# Patient Record
Sex: Male | Born: 1945
Health system: Southern US, Community
[De-identification: ages and names within clinical notes are randomized; demographics above are authoritative.]

## PROBLEM LIST (undated history)

## (undated) DIAGNOSIS — N289 Disorder of kidney and ureter, unspecified: Secondary | ICD-10-CM

## (undated) DIAGNOSIS — I2510 Atherosclerotic heart disease of native coronary artery without angina pectoris: Secondary | ICD-10-CM

## (undated) DIAGNOSIS — I10 Essential (primary) hypertension: Secondary | ICD-10-CM

## (undated) DIAGNOSIS — E079 Disorder of thyroid, unspecified: Secondary | ICD-10-CM

---

## 2008-10-28 ENCOUNTER — Inpatient Hospital Stay: Payer: Self-pay | Admitting: Internal Medicine

## 2011-04-09 ENCOUNTER — Ambulatory Visit: Payer: Self-pay | Admitting: Unknown Physician Specialty

## 2011-04-10 LAB — PATHOLOGY REPORT

## 2011-08-16 ENCOUNTER — Ambulatory Visit: Payer: Self-pay | Admitting: Cardiovascular Disease

## 2011-11-14 DIAGNOSIS — E782 Mixed hyperlipidemia: Secondary | ICD-10-CM | POA: Diagnosis not present

## 2011-11-14 DIAGNOSIS — I10 Essential (primary) hypertension: Secondary | ICD-10-CM | POA: Diagnosis not present

## 2011-11-14 DIAGNOSIS — M25529 Pain in unspecified elbow: Secondary | ICD-10-CM | POA: Diagnosis not present

## 2011-11-14 DIAGNOSIS — M7020 Olecranon bursitis, unspecified elbow: Secondary | ICD-10-CM | POA: Diagnosis not present

## 2011-11-14 DIAGNOSIS — R229 Localized swelling, mass and lump, unspecified: Secondary | ICD-10-CM | POA: Diagnosis not present

## 2011-11-20 DIAGNOSIS — M7020 Olecranon bursitis, unspecified elbow: Secondary | ICD-10-CM | POA: Diagnosis not present

## 2011-11-20 DIAGNOSIS — N189 Chronic kidney disease, unspecified: Secondary | ICD-10-CM | POA: Diagnosis not present

## 2011-11-20 DIAGNOSIS — I4891 Unspecified atrial fibrillation: Secondary | ICD-10-CM | POA: Diagnosis not present

## 2011-11-20 DIAGNOSIS — Z79899 Other long term (current) drug therapy: Secondary | ICD-10-CM | POA: Diagnosis not present

## 2011-11-20 DIAGNOSIS — R0602 Shortness of breath: Secondary | ICD-10-CM | POA: Diagnosis not present

## 2011-11-20 DIAGNOSIS — I10 Essential (primary) hypertension: Secondary | ICD-10-CM | POA: Diagnosis not present

## 2011-11-20 DIAGNOSIS — E782 Mixed hyperlipidemia: Secondary | ICD-10-CM | POA: Diagnosis not present

## 2011-11-21 DIAGNOSIS — M7020 Olecranon bursitis, unspecified elbow: Secondary | ICD-10-CM | POA: Diagnosis not present

## 2011-12-03 DIAGNOSIS — I6529 Occlusion and stenosis of unspecified carotid artery: Secondary | ICD-10-CM | POA: Diagnosis not present

## 2011-12-03 DIAGNOSIS — E785 Hyperlipidemia, unspecified: Secondary | ICD-10-CM | POA: Diagnosis not present

## 2011-12-03 DIAGNOSIS — R0989 Other specified symptoms and signs involving the circulatory and respiratory systems: Secondary | ICD-10-CM | POA: Diagnosis not present

## 2011-12-03 DIAGNOSIS — I10 Essential (primary) hypertension: Secondary | ICD-10-CM | POA: Diagnosis not present

## 2011-12-10 DIAGNOSIS — E876 Hypokalemia: Secondary | ICD-10-CM | POA: Diagnosis not present

## 2011-12-10 DIAGNOSIS — I509 Heart failure, unspecified: Secondary | ICD-10-CM | POA: Diagnosis not present

## 2011-12-10 DIAGNOSIS — I10 Essential (primary) hypertension: Secondary | ICD-10-CM | POA: Diagnosis not present

## 2012-01-17 DIAGNOSIS — I428 Other cardiomyopathies: Secondary | ICD-10-CM | POA: Diagnosis not present

## 2012-01-17 DIAGNOSIS — I509 Heart failure, unspecified: Secondary | ICD-10-CM | POA: Diagnosis not present

## 2012-01-17 DIAGNOSIS — I4891 Unspecified atrial fibrillation: Secondary | ICD-10-CM | POA: Diagnosis not present

## 2012-01-17 DIAGNOSIS — I10 Essential (primary) hypertension: Secondary | ICD-10-CM | POA: Diagnosis not present

## 2012-01-22 DIAGNOSIS — Z79899 Other long term (current) drug therapy: Secondary | ICD-10-CM | POA: Diagnosis not present

## 2012-01-22 DIAGNOSIS — I501 Left ventricular failure: Secondary | ICD-10-CM | POA: Diagnosis not present

## 2012-01-22 DIAGNOSIS — E785 Hyperlipidemia, unspecified: Secondary | ICD-10-CM | POA: Diagnosis not present

## 2012-01-22 DIAGNOSIS — I2510 Atherosclerotic heart disease of native coronary artery without angina pectoris: Secondary | ICD-10-CM | POA: Diagnosis not present

## 2012-05-20 DIAGNOSIS — I428 Other cardiomyopathies: Secondary | ICD-10-CM | POA: Diagnosis not present

## 2012-05-20 DIAGNOSIS — Z5181 Encounter for therapeutic drug level monitoring: Secondary | ICD-10-CM | POA: Diagnosis not present

## 2012-05-20 DIAGNOSIS — I10 Essential (primary) hypertension: Secondary | ICD-10-CM | POA: Diagnosis not present

## 2012-05-20 DIAGNOSIS — I4891 Unspecified atrial fibrillation: Secondary | ICD-10-CM | POA: Diagnosis not present

## 2012-05-20 DIAGNOSIS — I509 Heart failure, unspecified: Secondary | ICD-10-CM | POA: Diagnosis not present

## 2012-05-22 DIAGNOSIS — N189 Chronic kidney disease, unspecified: Secondary | ICD-10-CM | POA: Diagnosis not present

## 2012-05-22 DIAGNOSIS — Z125 Encounter for screening for malignant neoplasm of prostate: Secondary | ICD-10-CM | POA: Diagnosis not present

## 2012-05-22 DIAGNOSIS — E782 Mixed hyperlipidemia: Secondary | ICD-10-CM | POA: Diagnosis not present

## 2012-05-22 DIAGNOSIS — I498 Other specified cardiac arrhythmias: Secondary | ICD-10-CM | POA: Diagnosis not present

## 2012-05-22 DIAGNOSIS — I4891 Unspecified atrial fibrillation: Secondary | ICD-10-CM | POA: Diagnosis not present

## 2012-05-22 DIAGNOSIS — Z0000 Encounter for general adult medical examination without abnormal findings: Secondary | ICD-10-CM | POA: Diagnosis not present

## 2012-05-22 DIAGNOSIS — I10 Essential (primary) hypertension: Secondary | ICD-10-CM | POA: Diagnosis not present

## 2012-06-02 DIAGNOSIS — E785 Hyperlipidemia, unspecified: Secondary | ICD-10-CM | POA: Diagnosis not present

## 2012-06-02 DIAGNOSIS — R0989 Other specified symptoms and signs involving the circulatory and respiratory systems: Secondary | ICD-10-CM | POA: Diagnosis not present

## 2012-06-02 DIAGNOSIS — I6529 Occlusion and stenosis of unspecified carotid artery: Secondary | ICD-10-CM | POA: Diagnosis not present

## 2012-06-02 DIAGNOSIS — I10 Essential (primary) hypertension: Secondary | ICD-10-CM | POA: Diagnosis not present

## 2012-06-27 DIAGNOSIS — I10 Essential (primary) hypertension: Secondary | ICD-10-CM | POA: Diagnosis not present

## 2012-06-27 DIAGNOSIS — I4891 Unspecified atrial fibrillation: Secondary | ICD-10-CM | POA: Diagnosis not present

## 2012-06-27 DIAGNOSIS — R7989 Other specified abnormal findings of blood chemistry: Secondary | ICD-10-CM | POA: Diagnosis not present

## 2012-06-27 DIAGNOSIS — N269 Renal sclerosis, unspecified: Secondary | ICD-10-CM | POA: Diagnosis not present

## 2012-07-12 DIAGNOSIS — Z23 Encounter for immunization: Secondary | ICD-10-CM | POA: Diagnosis not present

## 2012-08-20 DIAGNOSIS — L0231 Cutaneous abscess of buttock: Secondary | ICD-10-CM | POA: Diagnosis not present

## 2012-08-26 DIAGNOSIS — L03317 Cellulitis of buttock: Secondary | ICD-10-CM | POA: Diagnosis not present

## 2012-08-26 DIAGNOSIS — L02818 Cutaneous abscess of other sites: Secondary | ICD-10-CM | POA: Diagnosis not present

## 2012-09-03 DIAGNOSIS — I4891 Unspecified atrial fibrillation: Secondary | ICD-10-CM | POA: Diagnosis not present

## 2012-09-03 DIAGNOSIS — I10 Essential (primary) hypertension: Secondary | ICD-10-CM | POA: Diagnosis not present

## 2012-09-03 DIAGNOSIS — I43 Cardiomyopathy in diseases classified elsewhere: Secondary | ICD-10-CM | POA: Diagnosis not present

## 2012-09-03 DIAGNOSIS — L0231 Cutaneous abscess of buttock: Secondary | ICD-10-CM | POA: Diagnosis not present

## 2012-09-03 DIAGNOSIS — A4902 Methicillin resistant Staphylococcus aureus infection, unspecified site: Secondary | ICD-10-CM | POA: Diagnosis not present

## 2012-09-03 DIAGNOSIS — N189 Chronic kidney disease, unspecified: Secondary | ICD-10-CM | POA: Diagnosis not present

## 2012-09-11 ENCOUNTER — Encounter: Payer: Self-pay | Admitting: Nurse Practitioner

## 2012-09-11 ENCOUNTER — Encounter: Payer: Self-pay | Admitting: Cardiothoracic Surgery

## 2012-09-11 DIAGNOSIS — I4891 Unspecified atrial fibrillation: Secondary | ICD-10-CM | POA: Diagnosis not present

## 2012-09-11 DIAGNOSIS — I129 Hypertensive chronic kidney disease with stage 1 through stage 4 chronic kidney disease, or unspecified chronic kidney disease: Secondary | ICD-10-CM | POA: Diagnosis not present

## 2012-09-11 DIAGNOSIS — I43 Cardiomyopathy in diseases classified elsewhere: Secondary | ICD-10-CM | POA: Diagnosis not present

## 2012-09-11 DIAGNOSIS — N189 Chronic kidney disease, unspecified: Secondary | ICD-10-CM | POA: Diagnosis not present

## 2012-09-11 DIAGNOSIS — L03317 Cellulitis of buttock: Secondary | ICD-10-CM | POA: Diagnosis not present

## 2012-09-11 DIAGNOSIS — I509 Heart failure, unspecified: Secondary | ICD-10-CM | POA: Diagnosis not present

## 2012-09-25 DIAGNOSIS — I509 Heart failure, unspecified: Secondary | ICD-10-CM | POA: Diagnosis not present

## 2012-09-25 DIAGNOSIS — I129 Hypertensive chronic kidney disease with stage 1 through stage 4 chronic kidney disease, or unspecified chronic kidney disease: Secondary | ICD-10-CM | POA: Diagnosis not present

## 2012-09-25 DIAGNOSIS — I4891 Unspecified atrial fibrillation: Secondary | ICD-10-CM | POA: Diagnosis not present

## 2012-09-25 DIAGNOSIS — L0231 Cutaneous abscess of buttock: Secondary | ICD-10-CM | POA: Diagnosis not present

## 2012-09-25 DIAGNOSIS — N189 Chronic kidney disease, unspecified: Secondary | ICD-10-CM | POA: Diagnosis not present

## 2012-09-25 DIAGNOSIS — I43 Cardiomyopathy in diseases classified elsewhere: Secondary | ICD-10-CM | POA: Diagnosis not present

## 2012-09-25 DIAGNOSIS — L03317 Cellulitis of buttock: Secondary | ICD-10-CM | POA: Diagnosis not present

## 2012-10-20 DIAGNOSIS — R072 Precordial pain: Secondary | ICD-10-CM | POA: Diagnosis not present

## 2012-10-22 DIAGNOSIS — I428 Other cardiomyopathies: Secondary | ICD-10-CM | POA: Diagnosis not present

## 2012-10-22 DIAGNOSIS — I4891 Unspecified atrial fibrillation: Secondary | ICD-10-CM | POA: Diagnosis not present

## 2012-10-22 DIAGNOSIS — I509 Heart failure, unspecified: Secondary | ICD-10-CM | POA: Diagnosis not present

## 2012-10-22 DIAGNOSIS — I10 Essential (primary) hypertension: Secondary | ICD-10-CM | POA: Diagnosis not present

## 2012-10-22 DIAGNOSIS — I2510 Atherosclerotic heart disease of native coronary artery without angina pectoris: Secondary | ICD-10-CM | POA: Diagnosis not present

## 2012-10-24 DIAGNOSIS — I4891 Unspecified atrial fibrillation: Secondary | ICD-10-CM | POA: Diagnosis not present

## 2012-10-24 DIAGNOSIS — Z79899 Other long term (current) drug therapy: Secondary | ICD-10-CM | POA: Diagnosis not present

## 2012-10-24 DIAGNOSIS — N189 Chronic kidney disease, unspecified: Secondary | ICD-10-CM | POA: Diagnosis not present

## 2012-10-24 DIAGNOSIS — I509 Heart failure, unspecified: Secondary | ICD-10-CM | POA: Diagnosis not present

## 2012-10-24 DIAGNOSIS — I428 Other cardiomyopathies: Secondary | ICD-10-CM | POA: Diagnosis not present

## 2012-10-24 DIAGNOSIS — I10 Essential (primary) hypertension: Secondary | ICD-10-CM | POA: Diagnosis not present

## 2012-10-24 DIAGNOSIS — I2510 Atherosclerotic heart disease of native coronary artery without angina pectoris: Secondary | ICD-10-CM | POA: Diagnosis not present

## 2012-10-24 DIAGNOSIS — E785 Hyperlipidemia, unspecified: Secondary | ICD-10-CM | POA: Diagnosis not present

## 2012-11-25 DIAGNOSIS — I4892 Unspecified atrial flutter: Secondary | ICD-10-CM | POA: Diagnosis not present

## 2012-11-25 DIAGNOSIS — I4891 Unspecified atrial fibrillation: Secondary | ICD-10-CM | POA: Diagnosis not present

## 2012-11-25 DIAGNOSIS — I10 Essential (primary) hypertension: Secondary | ICD-10-CM | POA: Diagnosis not present

## 2012-11-25 DIAGNOSIS — I509 Heart failure, unspecified: Secondary | ICD-10-CM | POA: Diagnosis not present

## 2012-12-04 DIAGNOSIS — I2510 Atherosclerotic heart disease of native coronary artery without angina pectoris: Secondary | ICD-10-CM | POA: Diagnosis not present

## 2012-12-04 DIAGNOSIS — R0989 Other specified symptoms and signs involving the circulatory and respiratory systems: Secondary | ICD-10-CM | POA: Diagnosis not present

## 2012-12-04 DIAGNOSIS — I6529 Occlusion and stenosis of unspecified carotid artery: Secondary | ICD-10-CM | POA: Diagnosis not present

## 2012-12-04 DIAGNOSIS — I10 Essential (primary) hypertension: Secondary | ICD-10-CM | POA: Diagnosis not present

## 2013-02-04 DIAGNOSIS — Z7902 Long term (current) use of antithrombotics/antiplatelets: Secondary | ICD-10-CM | POA: Diagnosis not present

## 2013-02-04 DIAGNOSIS — N189 Chronic kidney disease, unspecified: Secondary | ICD-10-CM | POA: Diagnosis not present

## 2013-02-04 DIAGNOSIS — E782 Mixed hyperlipidemia: Secondary | ICD-10-CM | POA: Diagnosis not present

## 2013-02-04 DIAGNOSIS — I10 Essential (primary) hypertension: Secondary | ICD-10-CM | POA: Diagnosis not present

## 2013-02-04 DIAGNOSIS — I4891 Unspecified atrial fibrillation: Secondary | ICD-10-CM | POA: Diagnosis not present

## 2013-02-04 DIAGNOSIS — R7301 Impaired fasting glucose: Secondary | ICD-10-CM | POA: Diagnosis not present

## 2013-03-03 ENCOUNTER — Emergency Department: Payer: Self-pay | Admitting: Emergency Medicine

## 2013-03-03 DIAGNOSIS — R55 Syncope and collapse: Secondary | ICD-10-CM | POA: Diagnosis not present

## 2013-03-03 DIAGNOSIS — J00 Acute nasopharyngitis [common cold]: Secondary | ICD-10-CM | POA: Diagnosis not present

## 2013-03-03 DIAGNOSIS — Z7982 Long term (current) use of aspirin: Secondary | ICD-10-CM | POA: Diagnosis not present

## 2013-03-03 DIAGNOSIS — Z79899 Other long term (current) drug therapy: Secondary | ICD-10-CM | POA: Diagnosis not present

## 2013-03-03 DIAGNOSIS — J029 Acute pharyngitis, unspecified: Secondary | ICD-10-CM | POA: Diagnosis not present

## 2013-03-03 DIAGNOSIS — R42 Dizziness and giddiness: Secondary | ICD-10-CM | POA: Diagnosis not present

## 2013-03-03 DIAGNOSIS — R6889 Other general symptoms and signs: Secondary | ICD-10-CM | POA: Diagnosis not present

## 2013-03-03 DIAGNOSIS — I4891 Unspecified atrial fibrillation: Secondary | ICD-10-CM | POA: Diagnosis not present

## 2013-03-03 DIAGNOSIS — I498 Other specified cardiac arrhythmias: Secondary | ICD-10-CM | POA: Diagnosis not present

## 2013-03-03 DIAGNOSIS — I10 Essential (primary) hypertension: Secondary | ICD-10-CM | POA: Diagnosis not present

## 2013-03-03 LAB — URINALYSIS, COMPLETE
Leukocyte Esterase: NEGATIVE
Nitrite: NEGATIVE
Ph: 5 (ref 4.5–8.0)
Specific Gravity: 1.026 (ref 1.003–1.030)
WBC UR: 1 /HPF (ref 0–5)

## 2013-03-03 LAB — COMPREHENSIVE METABOLIC PANEL
Anion Gap: 9 (ref 7–16)
BUN: 18 mg/dL (ref 7–18)
Chloride: 107 mmol/L (ref 98–107)
Co2: 24 mmol/L (ref 21–32)
Creatinine: 2.14 mg/dL — ABNORMAL HIGH (ref 0.60–1.30)
EGFR (African American): 36 — ABNORMAL LOW
EGFR (Non-African Amer.): 31 — ABNORMAL LOW
Glucose: 134 mg/dL — ABNORMAL HIGH (ref 65–99)
Osmolality: 283 (ref 275–301)
Potassium: 3.6 mmol/L (ref 3.5–5.1)
SGOT(AST): 23 U/L (ref 15–37)
SGPT (ALT): 23 U/L (ref 12–78)
Total Protein: 8 g/dL (ref 6.4–8.2)

## 2013-03-03 LAB — CK TOTAL AND CKMB (NOT AT ARMC): CK, Total: 187 U/L (ref 35–232)

## 2013-03-03 LAB — CBC
HCT: 43.2 % (ref 40.0–52.0)
HGB: 14.7 g/dL (ref 13.0–18.0)
MCH: 30.6 pg (ref 26.0–34.0)
MCHC: 34.1 g/dL (ref 32.0–36.0)
MCV: 90 fL (ref 80–100)
RDW: 14.7 % — ABNORMAL HIGH (ref 11.5–14.5)

## 2013-03-06 DIAGNOSIS — I4892 Unspecified atrial flutter: Secondary | ICD-10-CM | POA: Diagnosis not present

## 2013-03-06 DIAGNOSIS — J3089 Other allergic rhinitis: Secondary | ICD-10-CM | POA: Diagnosis not present

## 2013-03-06 DIAGNOSIS — N189 Chronic kidney disease, unspecified: Secondary | ICD-10-CM | POA: Diagnosis not present

## 2013-03-06 DIAGNOSIS — I428 Other cardiomyopathies: Secondary | ICD-10-CM | POA: Diagnosis not present

## 2013-03-06 DIAGNOSIS — I43 Cardiomyopathy in diseases classified elsewhere: Secondary | ICD-10-CM | POA: Diagnosis not present

## 2013-03-06 DIAGNOSIS — I959 Hypotension, unspecified: Secondary | ICD-10-CM | POA: Diagnosis not present

## 2013-03-06 DIAGNOSIS — I10 Essential (primary) hypertension: Secondary | ICD-10-CM | POA: Diagnosis not present

## 2013-03-06 DIAGNOSIS — I4891 Unspecified atrial fibrillation: Secondary | ICD-10-CM | POA: Diagnosis not present

## 2013-03-06 DIAGNOSIS — R42 Dizziness and giddiness: Secondary | ICD-10-CM | POA: Diagnosis not present

## 2013-03-06 DIAGNOSIS — I498 Other specified cardiac arrhythmias: Secondary | ICD-10-CM | POA: Diagnosis not present

## 2013-03-24 DIAGNOSIS — I509 Heart failure, unspecified: Secondary | ICD-10-CM | POA: Diagnosis not present

## 2013-03-24 DIAGNOSIS — I4891 Unspecified atrial fibrillation: Secondary | ICD-10-CM | POA: Diagnosis not present

## 2013-03-24 DIAGNOSIS — E785 Hyperlipidemia, unspecified: Secondary | ICD-10-CM | POA: Diagnosis not present

## 2013-03-24 DIAGNOSIS — I10 Essential (primary) hypertension: Secondary | ICD-10-CM | POA: Diagnosis not present

## 2013-04-21 DIAGNOSIS — I428 Other cardiomyopathies: Secondary | ICD-10-CM | POA: Diagnosis not present

## 2013-04-21 DIAGNOSIS — I4891 Unspecified atrial fibrillation: Secondary | ICD-10-CM | POA: Diagnosis not present

## 2013-04-21 DIAGNOSIS — I509 Heart failure, unspecified: Secondary | ICD-10-CM | POA: Diagnosis not present

## 2013-05-05 DIAGNOSIS — I10 Essential (primary) hypertension: Secondary | ICD-10-CM | POA: Diagnosis not present

## 2013-05-05 DIAGNOSIS — I509 Heart failure, unspecified: Secondary | ICD-10-CM | POA: Diagnosis not present

## 2013-05-05 DIAGNOSIS — I4891 Unspecified atrial fibrillation: Secondary | ICD-10-CM | POA: Diagnosis not present

## 2013-05-26 DIAGNOSIS — Z0000 Encounter for general adult medical examination without abnormal findings: Secondary | ICD-10-CM | POA: Diagnosis not present

## 2013-05-26 DIAGNOSIS — R7301 Impaired fasting glucose: Secondary | ICD-10-CM | POA: Diagnosis not present

## 2013-05-26 DIAGNOSIS — Z125 Encounter for screening for malignant neoplasm of prostate: Secondary | ICD-10-CM | POA: Diagnosis not present

## 2013-05-26 DIAGNOSIS — E782 Mixed hyperlipidemia: Secondary | ICD-10-CM | POA: Diagnosis not present

## 2013-05-26 DIAGNOSIS — R5381 Other malaise: Secondary | ICD-10-CM | POA: Diagnosis not present

## 2013-05-26 DIAGNOSIS — D649 Anemia, unspecified: Secondary | ICD-10-CM | POA: Diagnosis not present

## 2013-06-04 DIAGNOSIS — I70219 Atherosclerosis of native arteries of extremities with intermittent claudication, unspecified extremity: Secondary | ICD-10-CM | POA: Diagnosis not present

## 2013-06-04 DIAGNOSIS — E785 Hyperlipidemia, unspecified: Secondary | ICD-10-CM | POA: Diagnosis not present

## 2013-06-04 DIAGNOSIS — I6529 Occlusion and stenosis of unspecified carotid artery: Secondary | ICD-10-CM | POA: Diagnosis not present

## 2013-06-04 DIAGNOSIS — I10 Essential (primary) hypertension: Secondary | ICD-10-CM | POA: Diagnosis not present

## 2013-06-09 DIAGNOSIS — I10 Essential (primary) hypertension: Secondary | ICD-10-CM | POA: Diagnosis not present

## 2013-06-09 DIAGNOSIS — I43 Cardiomyopathy in diseases classified elsewhere: Secondary | ICD-10-CM | POA: Diagnosis not present

## 2013-06-09 DIAGNOSIS — Z7901 Long term (current) use of anticoagulants: Secondary | ICD-10-CM | POA: Diagnosis not present

## 2013-06-09 DIAGNOSIS — R7309 Other abnormal glucose: Secondary | ICD-10-CM | POA: Diagnosis not present

## 2013-06-09 DIAGNOSIS — E039 Hypothyroidism, unspecified: Secondary | ICD-10-CM | POA: Diagnosis not present

## 2013-06-09 DIAGNOSIS — Z0000 Encounter for general adult medical examination without abnormal findings: Secondary | ICD-10-CM | POA: Diagnosis not present

## 2013-06-09 DIAGNOSIS — E782 Mixed hyperlipidemia: Secondary | ICD-10-CM | POA: Diagnosis not present

## 2013-06-09 DIAGNOSIS — I4891 Unspecified atrial fibrillation: Secondary | ICD-10-CM | POA: Diagnosis not present

## 2013-06-26 DIAGNOSIS — I10 Essential (primary) hypertension: Secondary | ICD-10-CM | POA: Diagnosis not present

## 2013-06-26 DIAGNOSIS — I5022 Chronic systolic (congestive) heart failure: Secondary | ICD-10-CM | POA: Diagnosis not present

## 2013-08-12 DIAGNOSIS — Z23 Encounter for immunization: Secondary | ICD-10-CM | POA: Diagnosis not present

## 2013-09-30 DIAGNOSIS — N189 Chronic kidney disease, unspecified: Secondary | ICD-10-CM | POA: Diagnosis not present

## 2013-09-30 DIAGNOSIS — E039 Hypothyroidism, unspecified: Secondary | ICD-10-CM | POA: Diagnosis not present

## 2013-10-05 DIAGNOSIS — I10 Essential (primary) hypertension: Secondary | ICD-10-CM | POA: Diagnosis not present

## 2013-10-05 DIAGNOSIS — I428 Other cardiomyopathies: Secondary | ICD-10-CM | POA: Diagnosis not present

## 2013-10-05 DIAGNOSIS — I4891 Unspecified atrial fibrillation: Secondary | ICD-10-CM | POA: Diagnosis not present

## 2013-10-05 DIAGNOSIS — I509 Heart failure, unspecified: Secondary | ICD-10-CM | POA: Diagnosis not present

## 2013-10-07 DIAGNOSIS — I669 Occlusion and stenosis of unspecified cerebral artery: Secondary | ICD-10-CM | POA: Diagnosis not present

## 2013-10-07 DIAGNOSIS — I4891 Unspecified atrial fibrillation: Secondary | ICD-10-CM | POA: Diagnosis not present

## 2013-10-07 DIAGNOSIS — Z79899 Other long term (current) drug therapy: Secondary | ICD-10-CM | POA: Diagnosis not present

## 2013-10-07 DIAGNOSIS — I129 Hypertensive chronic kidney disease with stage 1 through stage 4 chronic kidney disease, or unspecified chronic kidney disease: Secondary | ICD-10-CM | POA: Diagnosis not present

## 2013-10-07 DIAGNOSIS — M159 Polyosteoarthritis, unspecified: Secondary | ICD-10-CM | POA: Diagnosis not present

## 2013-10-07 DIAGNOSIS — Z006 Encounter for examination for normal comparison and control in clinical research program: Secondary | ICD-10-CM | POA: Diagnosis not present

## 2013-10-07 DIAGNOSIS — I10 Essential (primary) hypertension: Secondary | ICD-10-CM | POA: Diagnosis not present

## 2013-10-07 DIAGNOSIS — Z7902 Long term (current) use of antithrombotics/antiplatelets: Secondary | ICD-10-CM | POA: Diagnosis not present

## 2013-10-07 DIAGNOSIS — E039 Hypothyroidism, unspecified: Secondary | ICD-10-CM | POA: Diagnosis not present

## 2013-10-13 DIAGNOSIS — M1710 Unilateral primary osteoarthritis, unspecified knee: Secondary | ICD-10-CM | POA: Diagnosis not present

## 2013-12-21 DIAGNOSIS — N183 Chronic kidney disease, stage 3 unspecified: Secondary | ICD-10-CM | POA: Diagnosis not present

## 2013-12-21 DIAGNOSIS — I5022 Chronic systolic (congestive) heart failure: Secondary | ICD-10-CM | POA: Diagnosis not present

## 2013-12-21 DIAGNOSIS — I10 Essential (primary) hypertension: Secondary | ICD-10-CM | POA: Diagnosis not present

## 2013-12-30 DIAGNOSIS — M1710 Unilateral primary osteoarthritis, unspecified knee: Secondary | ICD-10-CM | POA: Diagnosis not present

## 2013-12-30 DIAGNOSIS — M2340 Loose body in knee, unspecified knee: Secondary | ICD-10-CM | POA: Diagnosis not present

## 2013-12-30 DIAGNOSIS — IMO0002 Reserved for concepts with insufficient information to code with codable children: Secondary | ICD-10-CM | POA: Diagnosis not present

## 2014-02-04 DIAGNOSIS — E039 Hypothyroidism, unspecified: Secondary | ICD-10-CM | POA: Diagnosis not present

## 2014-02-04 DIAGNOSIS — R7301 Impaired fasting glucose: Secondary | ICD-10-CM | POA: Diagnosis not present

## 2014-02-04 DIAGNOSIS — I4891 Unspecified atrial fibrillation: Secondary | ICD-10-CM | POA: Diagnosis not present

## 2014-02-04 DIAGNOSIS — I129 Hypertensive chronic kidney disease with stage 1 through stage 4 chronic kidney disease, or unspecified chronic kidney disease: Secondary | ICD-10-CM | POA: Diagnosis not present

## 2014-02-04 DIAGNOSIS — N189 Chronic kidney disease, unspecified: Secondary | ICD-10-CM | POA: Diagnosis not present

## 2014-02-04 DIAGNOSIS — I6529 Occlusion and stenosis of unspecified carotid artery: Secondary | ICD-10-CM | POA: Diagnosis not present

## 2014-02-04 DIAGNOSIS — M159 Polyosteoarthritis, unspecified: Secondary | ICD-10-CM | POA: Diagnosis not present

## 2014-02-04 DIAGNOSIS — E782 Mixed hyperlipidemia: Secondary | ICD-10-CM | POA: Diagnosis not present

## 2014-02-04 DIAGNOSIS — Z79899 Other long term (current) drug therapy: Secondary | ICD-10-CM | POA: Diagnosis not present

## 2014-04-26 DIAGNOSIS — R079 Chest pain, unspecified: Secondary | ICD-10-CM | POA: Diagnosis not present

## 2014-04-26 DIAGNOSIS — I4892 Unspecified atrial flutter: Secondary | ICD-10-CM | POA: Diagnosis not present

## 2014-04-26 DIAGNOSIS — I10 Essential (primary) hypertension: Secondary | ICD-10-CM | POA: Diagnosis not present

## 2014-04-26 DIAGNOSIS — I509 Heart failure, unspecified: Secondary | ICD-10-CM | POA: Diagnosis not present

## 2014-04-26 DIAGNOSIS — I428 Other cardiomyopathies: Secondary | ICD-10-CM | POA: Diagnosis not present

## 2014-04-26 DIAGNOSIS — I4891 Unspecified atrial fibrillation: Secondary | ICD-10-CM | POA: Diagnosis not present

## 2014-06-07 DIAGNOSIS — I70219 Atherosclerosis of native arteries of extremities with intermittent claudication, unspecified extremity: Secondary | ICD-10-CM | POA: Diagnosis not present

## 2014-06-07 DIAGNOSIS — E785 Hyperlipidemia, unspecified: Secondary | ICD-10-CM | POA: Diagnosis not present

## 2014-06-07 DIAGNOSIS — I10 Essential (primary) hypertension: Secondary | ICD-10-CM | POA: Diagnosis not present

## 2014-06-07 DIAGNOSIS — I6529 Occlusion and stenosis of unspecified carotid artery: Secondary | ICD-10-CM | POA: Diagnosis not present

## 2014-06-08 DIAGNOSIS — E782 Mixed hyperlipidemia: Secondary | ICD-10-CM | POA: Diagnosis not present

## 2014-06-08 DIAGNOSIS — R0602 Shortness of breath: Secondary | ICD-10-CM | POA: Diagnosis not present

## 2014-06-08 DIAGNOSIS — R7301 Impaired fasting glucose: Secondary | ICD-10-CM | POA: Diagnosis not present

## 2014-06-08 DIAGNOSIS — I4891 Unspecified atrial fibrillation: Secondary | ICD-10-CM | POA: Diagnosis not present

## 2014-06-08 DIAGNOSIS — E039 Hypothyroidism, unspecified: Secondary | ICD-10-CM | POA: Diagnosis not present

## 2014-06-08 DIAGNOSIS — I129 Hypertensive chronic kidney disease with stage 1 through stage 4 chronic kidney disease, or unspecified chronic kidney disease: Secondary | ICD-10-CM | POA: Diagnosis not present

## 2014-06-25 DIAGNOSIS — I10 Essential (primary) hypertension: Secondary | ICD-10-CM | POA: Diagnosis not present

## 2014-06-25 DIAGNOSIS — I5022 Chronic systolic (congestive) heart failure: Secondary | ICD-10-CM | POA: Diagnosis not present

## 2014-06-25 DIAGNOSIS — N184 Chronic kidney disease, stage 4 (severe): Secondary | ICD-10-CM | POA: Diagnosis not present

## 2014-06-25 DIAGNOSIS — N269 Renal sclerosis, unspecified: Secondary | ICD-10-CM | POA: Diagnosis not present

## 2014-08-04 DIAGNOSIS — Z23 Encounter for immunization: Secondary | ICD-10-CM | POA: Diagnosis not present

## 2014-10-26 DIAGNOSIS — I10 Essential (primary) hypertension: Secondary | ICD-10-CM | POA: Diagnosis not present

## 2014-10-26 DIAGNOSIS — I4891 Unspecified atrial fibrillation: Secondary | ICD-10-CM | POA: Diagnosis not present

## 2014-10-26 DIAGNOSIS — I420 Dilated cardiomyopathy: Secondary | ICD-10-CM | POA: Diagnosis not present

## 2014-11-02 DIAGNOSIS — I10 Essential (primary) hypertension: Secondary | ICD-10-CM | POA: Diagnosis not present

## 2014-11-02 DIAGNOSIS — I420 Dilated cardiomyopathy: Secondary | ICD-10-CM | POA: Diagnosis not present

## 2014-11-02 DIAGNOSIS — I4891 Unspecified atrial fibrillation: Secondary | ICD-10-CM | POA: Diagnosis not present

## 2014-12-23 DIAGNOSIS — I10 Essential (primary) hypertension: Secondary | ICD-10-CM | POA: Diagnosis not present

## 2014-12-23 DIAGNOSIS — E876 Hypokalemia: Secondary | ICD-10-CM | POA: Diagnosis not present

## 2014-12-23 DIAGNOSIS — N184 Chronic kidney disease, stage 4 (severe): Secondary | ICD-10-CM | POA: Diagnosis not present

## 2014-12-27 DIAGNOSIS — G8929 Other chronic pain: Secondary | ICD-10-CM | POA: Diagnosis not present

## 2014-12-27 DIAGNOSIS — M1711 Unilateral primary osteoarthritis, right knee: Secondary | ICD-10-CM | POA: Insufficient documentation

## 2014-12-27 DIAGNOSIS — M25561 Pain in right knee: Secondary | ICD-10-CM | POA: Diagnosis not present

## 2015-01-03 DIAGNOSIS — Z125 Encounter for screening for malignant neoplasm of prostate: Secondary | ICD-10-CM | POA: Diagnosis not present

## 2015-01-03 DIAGNOSIS — I6523 Occlusion and stenosis of bilateral carotid arteries: Secondary | ICD-10-CM | POA: Diagnosis not present

## 2015-01-03 DIAGNOSIS — Z0001 Encounter for general adult medical examination with abnormal findings: Secondary | ICD-10-CM | POA: Diagnosis not present

## 2015-01-03 DIAGNOSIS — I129 Hypertensive chronic kidney disease with stage 1 through stage 4 chronic kidney disease, or unspecified chronic kidney disease: Secondary | ICD-10-CM | POA: Diagnosis not present

## 2015-01-03 DIAGNOSIS — I70223 Atherosclerosis of native arteries of extremities with rest pain, bilateral legs: Secondary | ICD-10-CM | POA: Diagnosis not present

## 2015-01-03 DIAGNOSIS — E782 Mixed hyperlipidemia: Secondary | ICD-10-CM | POA: Diagnosis not present

## 2015-01-03 DIAGNOSIS — L309 Dermatitis, unspecified: Secondary | ICD-10-CM | POA: Diagnosis not present

## 2015-01-03 DIAGNOSIS — I482 Chronic atrial fibrillation: Secondary | ICD-10-CM | POA: Diagnosis not present

## 2015-01-06 DIAGNOSIS — G8929 Other chronic pain: Secondary | ICD-10-CM | POA: Diagnosis not present

## 2015-01-06 DIAGNOSIS — M25561 Pain in right knee: Secondary | ICD-10-CM | POA: Diagnosis not present

## 2015-01-14 ENCOUNTER — Ambulatory Visit: Payer: Self-pay | Admitting: Unknown Physician Specialty

## 2015-01-14 DIAGNOSIS — S83281A Other tear of lateral meniscus, current injury, right knee, initial encounter: Secondary | ICD-10-CM | POA: Diagnosis not present

## 2015-01-14 DIAGNOSIS — M7121 Synovial cyst of popliteal space [Baker], right knee: Secondary | ICD-10-CM | POA: Diagnosis not present

## 2015-01-14 DIAGNOSIS — S83241A Other tear of medial meniscus, current injury, right knee, initial encounter: Secondary | ICD-10-CM | POA: Diagnosis not present

## 2015-01-14 DIAGNOSIS — S83231A Complex tear of medial meniscus, current injury, right knee, initial encounter: Secondary | ICD-10-CM | POA: Diagnosis not present

## 2015-04-28 DIAGNOSIS — I4891 Unspecified atrial fibrillation: Secondary | ICD-10-CM | POA: Diagnosis not present

## 2015-04-28 DIAGNOSIS — I420 Dilated cardiomyopathy: Secondary | ICD-10-CM | POA: Diagnosis not present

## 2015-04-28 DIAGNOSIS — I2584 Coronary atherosclerosis due to calcified coronary lesion: Secondary | ICD-10-CM | POA: Diagnosis not present

## 2015-04-28 DIAGNOSIS — I340 Nonrheumatic mitral (valve) insufficiency: Secondary | ICD-10-CM | POA: Diagnosis not present

## 2015-06-13 DIAGNOSIS — I70213 Atherosclerosis of native arteries of extremities with intermittent claudication, bilateral legs: Secondary | ICD-10-CM | POA: Diagnosis not present

## 2015-06-13 DIAGNOSIS — E785 Hyperlipidemia, unspecified: Secondary | ICD-10-CM | POA: Diagnosis not present

## 2015-06-13 DIAGNOSIS — I739 Peripheral vascular disease, unspecified: Secondary | ICD-10-CM | POA: Diagnosis not present

## 2015-06-13 DIAGNOSIS — I10 Essential (primary) hypertension: Secondary | ICD-10-CM | POA: Diagnosis not present

## 2015-06-13 DIAGNOSIS — M1990 Unspecified osteoarthritis, unspecified site: Secondary | ICD-10-CM | POA: Diagnosis not present

## 2015-06-13 DIAGNOSIS — I6529 Occlusion and stenosis of unspecified carotid artery: Secondary | ICD-10-CM | POA: Diagnosis not present

## 2015-06-13 DIAGNOSIS — I6523 Occlusion and stenosis of bilateral carotid arteries: Secondary | ICD-10-CM | POA: Diagnosis not present

## 2015-06-13 DIAGNOSIS — M79609 Pain in unspecified limb: Secondary | ICD-10-CM | POA: Diagnosis not present

## 2015-06-22 DIAGNOSIS — N184 Chronic kidney disease, stage 4 (severe): Secondary | ICD-10-CM | POA: Diagnosis not present

## 2015-06-22 DIAGNOSIS — I5022 Chronic systolic (congestive) heart failure: Secondary | ICD-10-CM | POA: Diagnosis not present

## 2015-06-22 DIAGNOSIS — I10 Essential (primary) hypertension: Secondary | ICD-10-CM | POA: Diagnosis not present

## 2015-06-27 DIAGNOSIS — M170 Bilateral primary osteoarthritis of knee: Secondary | ICD-10-CM | POA: Diagnosis not present

## 2015-06-27 DIAGNOSIS — I70223 Atherosclerosis of native arteries of extremities with rest pain, bilateral legs: Secondary | ICD-10-CM | POA: Diagnosis not present

## 2015-06-27 DIAGNOSIS — I482 Chronic atrial fibrillation: Secondary | ICD-10-CM | POA: Diagnosis not present

## 2015-06-27 DIAGNOSIS — E039 Hypothyroidism, unspecified: Secondary | ICD-10-CM | POA: Diagnosis not present

## 2015-06-27 DIAGNOSIS — I129 Hypertensive chronic kidney disease with stage 1 through stage 4 chronic kidney disease, or unspecified chronic kidney disease: Secondary | ICD-10-CM | POA: Diagnosis not present

## 2015-06-30 DIAGNOSIS — M1712 Unilateral primary osteoarthritis, left knee: Secondary | ICD-10-CM | POA: Diagnosis not present

## 2015-06-30 DIAGNOSIS — M25562 Pain in left knee: Secondary | ICD-10-CM | POA: Diagnosis not present

## 2015-07-14 DIAGNOSIS — M1711 Unilateral primary osteoarthritis, right knee: Secondary | ICD-10-CM | POA: Diagnosis not present

## 2015-07-14 DIAGNOSIS — M1712 Unilateral primary osteoarthritis, left knee: Secondary | ICD-10-CM | POA: Diagnosis not present

## 2015-07-20 DIAGNOSIS — Z23 Encounter for immunization: Secondary | ICD-10-CM | POA: Diagnosis not present

## 2015-09-26 DIAGNOSIS — M7031 Other bursitis of elbow, right elbow: Secondary | ICD-10-CM | POA: Diagnosis not present

## 2015-09-26 DIAGNOSIS — M7032 Other bursitis of elbow, left elbow: Secondary | ICD-10-CM | POA: Diagnosis not present

## 2015-10-06 DIAGNOSIS — M7032 Other bursitis of elbow, left elbow: Secondary | ICD-10-CM | POA: Diagnosis not present

## 2015-10-13 DIAGNOSIS — M7032 Other bursitis of elbow, left elbow: Secondary | ICD-10-CM | POA: Diagnosis not present

## 2015-11-10 DIAGNOSIS — I4891 Unspecified atrial fibrillation: Secondary | ICD-10-CM | POA: Diagnosis not present

## 2015-11-10 DIAGNOSIS — I4892 Unspecified atrial flutter: Secondary | ICD-10-CM | POA: Diagnosis not present

## 2015-11-10 DIAGNOSIS — I509 Heart failure, unspecified: Secondary | ICD-10-CM | POA: Diagnosis not present

## 2015-11-10 DIAGNOSIS — I420 Dilated cardiomyopathy: Secondary | ICD-10-CM | POA: Diagnosis not present

## 2015-12-21 DIAGNOSIS — I10 Essential (primary) hypertension: Secondary | ICD-10-CM | POA: Diagnosis not present

## 2015-12-21 DIAGNOSIS — I129 Hypertensive chronic kidney disease with stage 1 through stage 4 chronic kidney disease, or unspecified chronic kidney disease: Secondary | ICD-10-CM | POA: Diagnosis not present

## 2015-12-21 DIAGNOSIS — I5022 Chronic systolic (congestive) heart failure: Secondary | ICD-10-CM | POA: Diagnosis not present

## 2015-12-21 DIAGNOSIS — N183 Chronic kidney disease, stage 3 (moderate): Secondary | ICD-10-CM | POA: Diagnosis not present

## 2016-01-05 DIAGNOSIS — I129 Hypertensive chronic kidney disease with stage 1 through stage 4 chronic kidney disease, or unspecified chronic kidney disease: Secondary | ICD-10-CM | POA: Diagnosis not present

## 2016-01-05 DIAGNOSIS — E782 Mixed hyperlipidemia: Secondary | ICD-10-CM | POA: Diagnosis not present

## 2016-01-05 DIAGNOSIS — I6523 Occlusion and stenosis of bilateral carotid arteries: Secondary | ICD-10-CM | POA: Diagnosis not present

## 2016-01-05 DIAGNOSIS — I482 Chronic atrial fibrillation: Secondary | ICD-10-CM | POA: Diagnosis not present

## 2016-01-05 DIAGNOSIS — Z125 Encounter for screening for malignant neoplasm of prostate: Secondary | ICD-10-CM | POA: Diagnosis not present

## 2016-01-05 DIAGNOSIS — Z0001 Encounter for general adult medical examination with abnormal findings: Secondary | ICD-10-CM | POA: Diagnosis not present

## 2016-01-05 DIAGNOSIS — R7301 Impaired fasting glucose: Secondary | ICD-10-CM | POA: Diagnosis not present

## 2016-01-05 DIAGNOSIS — E039 Hypothyroidism, unspecified: Secondary | ICD-10-CM | POA: Diagnosis not present

## 2016-01-06 DIAGNOSIS — E079 Disorder of thyroid, unspecified: Secondary | ICD-10-CM | POA: Diagnosis not present

## 2016-01-06 DIAGNOSIS — E782 Mixed hyperlipidemia: Secondary | ICD-10-CM | POA: Diagnosis not present

## 2016-01-06 DIAGNOSIS — R972 Elevated prostate specific antigen [PSA]: Secondary | ICD-10-CM | POA: Diagnosis not present

## 2016-01-06 DIAGNOSIS — E0781 Sick-euthyroid syndrome: Secondary | ICD-10-CM | POA: Diagnosis not present

## 2016-03-09 DIAGNOSIS — I4891 Unspecified atrial fibrillation: Secondary | ICD-10-CM | POA: Diagnosis not present

## 2016-03-09 DIAGNOSIS — R0602 Shortness of breath: Secondary | ICD-10-CM | POA: Diagnosis not present

## 2016-03-09 DIAGNOSIS — I2584 Coronary atherosclerosis due to calcified coronary lesion: Secondary | ICD-10-CM | POA: Diagnosis not present

## 2016-03-09 DIAGNOSIS — I420 Dilated cardiomyopathy: Secondary | ICD-10-CM | POA: Diagnosis not present

## 2016-03-26 DIAGNOSIS — R079 Chest pain, unspecified: Secondary | ICD-10-CM | POA: Diagnosis not present

## 2016-03-29 DIAGNOSIS — I10 Essential (primary) hypertension: Secondary | ICD-10-CM | POA: Diagnosis not present

## 2016-03-29 DIAGNOSIS — I2584 Coronary atherosclerosis due to calcified coronary lesion: Secondary | ICD-10-CM | POA: Diagnosis not present

## 2016-03-29 DIAGNOSIS — I493 Ventricular premature depolarization: Secondary | ICD-10-CM | POA: Diagnosis not present

## 2016-03-29 DIAGNOSIS — Z8679 Personal history of other diseases of the circulatory system: Secondary | ICD-10-CM | POA: Diagnosis not present

## 2016-06-14 DIAGNOSIS — E785 Hyperlipidemia, unspecified: Secondary | ICD-10-CM | POA: Diagnosis not present

## 2016-06-14 DIAGNOSIS — I6529 Occlusion and stenosis of unspecified carotid artery: Secondary | ICD-10-CM | POA: Diagnosis not present

## 2016-06-14 DIAGNOSIS — I6523 Occlusion and stenosis of bilateral carotid arteries: Secondary | ICD-10-CM | POA: Diagnosis not present

## 2016-06-14 DIAGNOSIS — M79609 Pain in unspecified limb: Secondary | ICD-10-CM | POA: Diagnosis not present

## 2016-06-14 DIAGNOSIS — M1990 Unspecified osteoarthritis, unspecified site: Secondary | ICD-10-CM | POA: Diagnosis not present

## 2016-06-14 DIAGNOSIS — I10 Essential (primary) hypertension: Secondary | ICD-10-CM | POA: Diagnosis not present

## 2016-06-14 DIAGNOSIS — I70213 Atherosclerosis of native arteries of extremities with intermittent claudication, bilateral legs: Secondary | ICD-10-CM | POA: Diagnosis not present

## 2016-06-14 DIAGNOSIS — I739 Peripheral vascular disease, unspecified: Secondary | ICD-10-CM | POA: Diagnosis not present

## 2016-06-20 DIAGNOSIS — N183 Chronic kidney disease, stage 3 (moderate): Secondary | ICD-10-CM | POA: Diagnosis not present

## 2016-06-20 DIAGNOSIS — I10 Essential (primary) hypertension: Secondary | ICD-10-CM | POA: Diagnosis not present

## 2016-06-20 DIAGNOSIS — I5022 Chronic systolic (congestive) heart failure: Secondary | ICD-10-CM | POA: Diagnosis not present

## 2016-07-03 DIAGNOSIS — I70223 Atherosclerosis of native arteries of extremities with rest pain, bilateral legs: Secondary | ICD-10-CM | POA: Diagnosis not present

## 2016-07-03 DIAGNOSIS — E039 Hypothyroidism, unspecified: Secondary | ICD-10-CM | POA: Diagnosis not present

## 2016-07-03 DIAGNOSIS — I129 Hypertensive chronic kidney disease with stage 1 through stage 4 chronic kidney disease, or unspecified chronic kidney disease: Secondary | ICD-10-CM | POA: Diagnosis not present

## 2016-07-03 DIAGNOSIS — I482 Chronic atrial fibrillation: Secondary | ICD-10-CM | POA: Diagnosis not present

## 2016-07-03 DIAGNOSIS — I6523 Occlusion and stenosis of bilateral carotid arteries: Secondary | ICD-10-CM | POA: Diagnosis not present

## 2016-07-03 DIAGNOSIS — R7301 Impaired fasting glucose: Secondary | ICD-10-CM | POA: Diagnosis not present

## 2016-08-02 DIAGNOSIS — Z23 Encounter for immunization: Secondary | ICD-10-CM | POA: Diagnosis not present

## 2016-08-30 DIAGNOSIS — R0602 Shortness of breath: Secondary | ICD-10-CM | POA: Diagnosis not present

## 2016-08-30 DIAGNOSIS — I4892 Unspecified atrial flutter: Secondary | ICD-10-CM | POA: Diagnosis not present

## 2016-08-30 DIAGNOSIS — I4891 Unspecified atrial fibrillation: Secondary | ICD-10-CM | POA: Diagnosis not present

## 2016-08-30 DIAGNOSIS — I10 Essential (primary) hypertension: Secondary | ICD-10-CM | POA: Diagnosis not present

## 2016-08-30 DIAGNOSIS — I509 Heart failure, unspecified: Secondary | ICD-10-CM | POA: Diagnosis not present

## 2016-11-27 DIAGNOSIS — M25562 Pain in left knee: Secondary | ICD-10-CM | POA: Diagnosis not present

## 2016-11-28 ENCOUNTER — Other Ambulatory Visit: Payer: Self-pay | Admitting: Unknown Physician Specialty

## 2016-11-28 DIAGNOSIS — M25562 Pain in left knee: Secondary | ICD-10-CM

## 2016-12-03 DIAGNOSIS — I10 Essential (primary) hypertension: Secondary | ICD-10-CM | POA: Diagnosis not present

## 2016-12-03 DIAGNOSIS — I5022 Chronic systolic (congestive) heart failure: Secondary | ICD-10-CM | POA: Diagnosis not present

## 2016-12-03 DIAGNOSIS — N184 Chronic kidney disease, stage 4 (severe): Secondary | ICD-10-CM | POA: Diagnosis not present

## 2016-12-07 ENCOUNTER — Ambulatory Visit
Admission: RE | Admit: 2016-12-07 | Discharge: 2016-12-07 | Disposition: A | Discharge status: Home / Self Care | Payer: Medicare Other | Source: Ambulatory Visit | Attending: Unknown Physician Specialty | Admitting: Unknown Physician Specialty

## 2016-12-07 DIAGNOSIS — M25562 Pain in left knee: Secondary | ICD-10-CM | POA: Diagnosis present

## 2016-12-07 DIAGNOSIS — M84452A Pathological fracture, left femur, initial encounter for fracture: Secondary | ICD-10-CM | POA: Diagnosis not present

## 2016-12-07 DIAGNOSIS — M1712 Unilateral primary osteoarthritis, left knee: Secondary | ICD-10-CM | POA: Diagnosis not present

## 2016-12-07 DIAGNOSIS — M25561 Pain in right knee: Secondary | ICD-10-CM | POA: Diagnosis not present

## 2016-12-25 DIAGNOSIS — M84351D Stress fracture, right femur, subsequent encounter for fracture with routine healing: Secondary | ICD-10-CM | POA: Insufficient documentation

## 2016-12-25 DIAGNOSIS — M1712 Unilateral primary osteoarthritis, left knee: Secondary | ICD-10-CM | POA: Diagnosis not present

## 2017-01-08 DIAGNOSIS — Z125 Encounter for screening for malignant neoplasm of prostate: Secondary | ICD-10-CM | POA: Diagnosis not present

## 2017-01-08 DIAGNOSIS — Z0001 Encounter for general adult medical examination with abnormal findings: Secondary | ICD-10-CM | POA: Diagnosis not present

## 2017-01-08 DIAGNOSIS — E782 Mixed hyperlipidemia: Secondary | ICD-10-CM | POA: Diagnosis not present

## 2017-01-08 DIAGNOSIS — I482 Chronic atrial fibrillation: Secondary | ICD-10-CM | POA: Diagnosis not present

## 2017-01-08 DIAGNOSIS — I70223 Atherosclerosis of native arteries of extremities with rest pain, bilateral legs: Secondary | ICD-10-CM | POA: Diagnosis not present

## 2017-01-08 DIAGNOSIS — D519 Vitamin B12 deficiency anemia, unspecified: Secondary | ICD-10-CM | POA: Diagnosis not present

## 2017-01-08 DIAGNOSIS — I10 Essential (primary) hypertension: Secondary | ICD-10-CM | POA: Diagnosis not present

## 2017-01-08 DIAGNOSIS — M792 Neuralgia and neuritis, unspecified: Secondary | ICD-10-CM | POA: Diagnosis not present

## 2017-01-08 DIAGNOSIS — R972 Elevated prostate specific antigen [PSA]: Secondary | ICD-10-CM | POA: Diagnosis not present

## 2017-01-09 DIAGNOSIS — M170 Bilateral primary osteoarthritis of knee: Secondary | ICD-10-CM | POA: Diagnosis not present

## 2017-01-09 DIAGNOSIS — M25562 Pain in left knee: Secondary | ICD-10-CM | POA: Diagnosis not present

## 2017-01-09 DIAGNOSIS — M25561 Pain in right knee: Secondary | ICD-10-CM | POA: Diagnosis not present

## 2017-01-14 DIAGNOSIS — M170 Bilateral primary osteoarthritis of knee: Secondary | ICD-10-CM | POA: Diagnosis not present

## 2017-01-14 DIAGNOSIS — M25562 Pain in left knee: Secondary | ICD-10-CM | POA: Diagnosis not present

## 2017-01-14 DIAGNOSIS — M1712 Unilateral primary osteoarthritis, left knee: Secondary | ICD-10-CM | POA: Diagnosis not present

## 2017-01-15 DIAGNOSIS — M25562 Pain in left knee: Secondary | ICD-10-CM | POA: Diagnosis not present

## 2017-01-15 DIAGNOSIS — M25462 Effusion, left knee: Secondary | ICD-10-CM | POA: Diagnosis not present

## 2017-01-15 DIAGNOSIS — M1712 Unilateral primary osteoarthritis, left knee: Secondary | ICD-10-CM | POA: Diagnosis not present

## 2017-01-17 DIAGNOSIS — M1711 Unilateral primary osteoarthritis, right knee: Secondary | ICD-10-CM | POA: Diagnosis not present

## 2017-01-17 DIAGNOSIS — M25561 Pain in right knee: Secondary | ICD-10-CM | POA: Diagnosis not present

## 2017-01-24 DIAGNOSIS — D519 Vitamin B12 deficiency anemia, unspecified: Secondary | ICD-10-CM | POA: Diagnosis not present

## 2017-01-28 DIAGNOSIS — I509 Heart failure, unspecified: Secondary | ICD-10-CM | POA: Diagnosis not present

## 2017-01-28 DIAGNOSIS — I4891 Unspecified atrial fibrillation: Secondary | ICD-10-CM | POA: Diagnosis not present

## 2017-01-28 DIAGNOSIS — I420 Dilated cardiomyopathy: Secondary | ICD-10-CM | POA: Diagnosis not present

## 2017-01-28 DIAGNOSIS — I2584 Coronary atherosclerosis due to calcified coronary lesion: Secondary | ICD-10-CM | POA: Diagnosis not present

## 2017-01-30 DIAGNOSIS — M25561 Pain in right knee: Secondary | ICD-10-CM | POA: Diagnosis not present

## 2017-01-30 DIAGNOSIS — M170 Bilateral primary osteoarthritis of knee: Secondary | ICD-10-CM | POA: Diagnosis not present

## 2017-01-30 DIAGNOSIS — M25562 Pain in left knee: Secondary | ICD-10-CM | POA: Diagnosis not present

## 2017-02-06 DIAGNOSIS — M25561 Pain in right knee: Secondary | ICD-10-CM | POA: Diagnosis not present

## 2017-02-06 DIAGNOSIS — M170 Bilateral primary osteoarthritis of knee: Secondary | ICD-10-CM | POA: Diagnosis not present

## 2017-02-06 DIAGNOSIS — M25562 Pain in left knee: Secondary | ICD-10-CM | POA: Diagnosis not present

## 2017-02-13 DIAGNOSIS — M170 Bilateral primary osteoarthritis of knee: Secondary | ICD-10-CM | POA: Diagnosis not present

## 2017-02-13 DIAGNOSIS — M25562 Pain in left knee: Secondary | ICD-10-CM | POA: Diagnosis not present

## 2017-02-13 DIAGNOSIS — M25561 Pain in right knee: Secondary | ICD-10-CM | POA: Diagnosis not present

## 2017-04-29 DIAGNOSIS — M7021 Olecranon bursitis, right elbow: Secondary | ICD-10-CM | POA: Diagnosis not present

## 2017-05-15 DIAGNOSIS — M170 Bilateral primary osteoarthritis of knee: Secondary | ICD-10-CM | POA: Diagnosis not present

## 2017-05-15 DIAGNOSIS — M25561 Pain in right knee: Secondary | ICD-10-CM | POA: Diagnosis not present

## 2017-05-15 DIAGNOSIS — M25562 Pain in left knee: Secondary | ICD-10-CM | POA: Diagnosis not present

## 2017-05-20 DIAGNOSIS — M7021 Olecranon bursitis, right elbow: Secondary | ICD-10-CM | POA: Diagnosis not present

## 2017-05-27 DIAGNOSIS — I5022 Chronic systolic (congestive) heart failure: Secondary | ICD-10-CM | POA: Diagnosis not present

## 2017-05-27 DIAGNOSIS — I10 Essential (primary) hypertension: Secondary | ICD-10-CM | POA: Diagnosis not present

## 2017-05-27 DIAGNOSIS — N183 Chronic kidney disease, stage 3 (moderate): Secondary | ICD-10-CM | POA: Diagnosis not present

## 2017-06-14 ENCOUNTER — Other Ambulatory Visit (INDEPENDENT_AMBULATORY_CARE_PROVIDER_SITE_OTHER): Payer: Self-pay | Admitting: Vascular Surgery

## 2017-06-14 DIAGNOSIS — I739 Peripheral vascular disease, unspecified: Secondary | ICD-10-CM

## 2017-06-14 DIAGNOSIS — I6523 Occlusion and stenosis of bilateral carotid arteries: Secondary | ICD-10-CM

## 2017-06-17 ENCOUNTER — Ambulatory Visit (INDEPENDENT_AMBULATORY_CARE_PROVIDER_SITE_OTHER): Payer: Medicare Other | Admitting: Vascular Surgery

## 2017-06-17 ENCOUNTER — Encounter (INDEPENDENT_AMBULATORY_CARE_PROVIDER_SITE_OTHER): Payer: Self-pay | Admitting: Vascular Surgery

## 2017-06-17 ENCOUNTER — Ambulatory Visit (INDEPENDENT_AMBULATORY_CARE_PROVIDER_SITE_OTHER): Payer: Medicare Other

## 2017-06-17 DIAGNOSIS — I25118 Atherosclerotic heart disease of native coronary artery with other forms of angina pectoris: Secondary | ICD-10-CM | POA: Diagnosis not present

## 2017-06-17 DIAGNOSIS — I10 Essential (primary) hypertension: Secondary | ICD-10-CM | POA: Diagnosis not present

## 2017-06-17 DIAGNOSIS — I6523 Occlusion and stenosis of bilateral carotid arteries: Secondary | ICD-10-CM | POA: Diagnosis not present

## 2017-06-17 DIAGNOSIS — I480 Paroxysmal atrial fibrillation: Secondary | ICD-10-CM | POA: Diagnosis not present

## 2017-06-17 DIAGNOSIS — I2510 Atherosclerotic heart disease of native coronary artery without angina pectoris: Secondary | ICD-10-CM | POA: Insufficient documentation

## 2017-06-17 DIAGNOSIS — I70219 Atherosclerosis of native arteries of extremities with intermittent claudication, unspecified extremity: Secondary | ICD-10-CM

## 2017-06-17 DIAGNOSIS — I4891 Unspecified atrial fibrillation: Secondary | ICD-10-CM | POA: Insufficient documentation

## 2017-06-17 DIAGNOSIS — I739 Peripheral vascular disease, unspecified: Secondary | ICD-10-CM

## 2017-06-17 DIAGNOSIS — I6529 Occlusion and stenosis of unspecified carotid artery: Secondary | ICD-10-CM | POA: Insufficient documentation

## 2017-06-17 NOTE — Progress Notes (Signed)
MRN : [id number]  Alan Bennett is a 71 y.o. (Jul 26, 1946) male who presents with chief complaint of  Chief Complaint  Patient presents with  . Follow-up    1 year f/u  .  History of Present Illness: The patient is seen for follow up evaluation of carotid stenosis and PAD. The carotid stenosis followed by ultrasound.   The patient denies amaurosis fugax. There is no recent history of TIA symptoms or focal motor deficits. There is no prior documented CVA.  He states his claudication distance is unchanged.  He denies life style limitation.  No interval ulcers or wounds  The patient is taking enteric-coated aspirin 81 mg daily.  There is no history of migraine headaches. There is no history of seizures.  The patient has a history of coronary artery disease, no recent episodes of angina or shortness of breath.  There is a history of hyperlipidemia which is being treated with a statin.    Carotid Duplex done today shows <50% bilateral ICA stenosis.  No change compared to last study in 06/14/2016  ABI's Rt=0.99 and Lt=0.76  Current Meds  Medication Sig  . amiodarone (PACERONE) 200 MG tablet   . amLODipine (NORVASC) 5 MG tablet   . aspirin EC 81 MG tablet Take by mouth.  Marland Kitchen atorvastatin (LIPITOR) 40 MG tablet   . atorvastatin (LIPITOR) 40 MG tablet   . clopidogrel (PLAVIX) 75 MG tablet   . isosorbide mononitrate (IMDUR) 30 MG 24 hr tablet   . levothyroxine (SYNTHROID, LEVOTHROID) 75 MCG tablet   . lisinopril (PRINIVIL,ZESTRIL) 5 MG tablet     No past medical history on file.  No past surgical history on file.  Social History Social History  Substance Use Topics  . Smoking status: Former Research scientist (life sciences)  . Smokeless tobacco: Never Used     Comment: 10 years ago  . Alcohol use Not on file    Family History No family history on file.  No Known Allergies   REVIEW OF SYSTEMS (Negative unless checked)  Constitutional: [] Weight loss  [] Fever  [] Chills Cardiac: [] Chest pain    [] Chest pressure   [] Palpitations   [] Shortness of breath when laying flat   [] Shortness of breath with exertion. Vascular:  [x] Pain in legs with walking   [] Pain in legs at rest  [] History of DVT   [] Phlebitis   [] Swelling in legs   [] Varicose veins   [] Non-healing ulcers Pulmonary:   [] Uses home oxygen   [] Productive cough   [] Hemoptysis   [] Wheeze  [] COPD   [] Asthma Neurologic:  [] Dizziness   [] Seizures   [] History of stroke   [] History of TIA  [] Aphasia   [] Vissual changes   [] Weakness or numbness in arm   [] Weakness or numbness in leg Musculoskeletal:   [] Joint swelling   [] Joint pain   [] Low back pain Hematologic:  [] Easy bruising  [] Easy bleeding   [] Hypercoagulable state   [] Anemic Gastrointestinal:  [] Diarrhea   [] Vomiting  [] Gastroesophageal reflux/heartburn   [] Difficulty swallowing. Genitourinary:  [] Chronic kidney disease   [] Difficult urination  [] Frequent urination   [] Blood in urine Skin:  [] Rashes   [] Ulcers  Psychological:  [] History of anxiety   []  History of major depression.  Physical Examination  Vitals:   06/17/17 0958  BP: (!) 120/54  Pulse: 72  SpO2: (!) 16%  Weight: 174 lb 3.2 oz (79 kg)  Height: 6\' 1"  (1.854 m)   Body mass index is 22.98 kg/m. Gen: WD/WN, NAD Head: [place]/AT, No temporalis  wasting.  Ear/Nose/Throat: Hearing grossly intact, nares w/o erythema or drainage Eyes: PER, EOMI, sclera nonicteric.  Neck: Supple, no large masses.   Pulmonary:  Good air movement, no audible wheezing bilaterally, no use of accessory muscles.  Cardiac: RRR, no JVD Vascular:  Right carotid bruit, feet pink and warm Vessel Right Left  Radial Palpable Palpable  Carotid Palpable Palpable  Popliteal Palpable Not Palpable  PT Not Palpable Not Palpable  DP Trace Palpable Not Palpable  Gastrointestinal: Non-distended. No guarding/no peritoneal signs.  Musculoskeletal: M/S 5/5 throughout.  No deformity or atrophy.  Neurologic: CN 2-12 intact. Symmetrical.  Speech is fluent.  Motor exam as listed above. Psychiatric: Judgment intact, Mood & affect appropriate for pt's clinical situation. Dermatologic: No rashes or ulcers noted.  No changes consistent with cellulitis. Lymph : No lichenification or skin changes of chronic lymphedema.  CBC Lab Results  Component Value Date   WBC 11.2 (H) 03/03/2013   HGB 14.7 03/03/2013   HCT 43.2 03/03/2013   MCV 90 03/03/2013   PLT 206 03/03/2013    BMET    Component Value Date/Time   NA 140 03/03/2013 0940   K 3.6 03/03/2013 0940   CL 107 03/03/2013 0940   CO2 24 03/03/2013 0940   GLUCOSE 134 (H) 03/03/2013 0940   BUN 18 03/03/2013 0940   CREATININE 2.14 (H) 03/03/2013 0940   CALCIUM 8.4 (L) 03/03/2013 0940   GFRNONAA 31 (L) 03/03/2013 0940   GFRAA 36 (L) 03/03/2013 0940   CrCl cannot be calculated (Patient's most recent lab result is older than the maximum 21 days allowed.).  COAG No results found for: INR, PROTIME  Radiology No results found.   Assessment/Plan 1. Bilateral carotid artery stenosis Recommend:  Given the patient's asymptomatic subcritical stenosis no further invasive testing or surgery at this time.  Duplex ultrasound shows <50% stenosis bilaterally.  Continue antiplatelet therapy as prescribed Continue management of CAD, HTN and Hyperlipidemia Healthy heart diet,  encouraged exercise at least 4 times per week Follow up in 12 months with duplex ultrasound and physical exam    - VAS US CAROTID; Future  2. Atherosclerosis of artery of extremity with intermittent claudication (HCC)  Recommend:  The patient has evidence of atherosclerosis of the lower extremities with claudication.  The patient does not voice lifestyle limiting changes at this point in time.  Noninvasive studies do not suggest clinically significant change.  No invasive studies, angiography or surgery at this time The patient should continue walking and begin a more formal exercise program.  The patient should  continue antiplatelet therapy and aggressive treatment of the lipid abnormalities  No changes in the patient's medications at this time  The patient should continue wearing graduated compression socks 10-15 mmHg strength to control the mild edema.   - VAS [place] ABI WITH/WO TBI; Future  3. Paroxysmal atrial fibrillation (HCC) Continue antiarrhythmia medications as already ordered, these medications have been reviewed and there are no changes at this time.  Continue anticoagulation as ordered by Cardiology Service   4. Essential hypertension Continue antihypertensive medications as already ordered, these medications have been reviewed and there are no changes at this time.   5. Coronary artery disease of native artery of native heart with stable angina pectoris ([place]) Continue cardiac and antihypertensive medications as already ordered and reviewed, no changes at this time.  Continue statin as ordered and reviewed, no changes at this time  Nitrates PRN for chest pain     Hortencia Pilar, MD  06/17/2017 12:51 PM

## 2017-07-01 DIAGNOSIS — I509 Heart failure, unspecified: Secondary | ICD-10-CM | POA: Diagnosis not present

## 2017-07-01 DIAGNOSIS — I2584 Coronary atherosclerosis due to calcified coronary lesion: Secondary | ICD-10-CM | POA: Diagnosis not present

## 2017-07-01 DIAGNOSIS — I4891 Unspecified atrial fibrillation: Secondary | ICD-10-CM | POA: Diagnosis not present

## 2017-07-01 DIAGNOSIS — I10 Essential (primary) hypertension: Secondary | ICD-10-CM | POA: Diagnosis not present

## 2017-07-04 DIAGNOSIS — R079 Chest pain, unspecified: Secondary | ICD-10-CM | POA: Diagnosis not present

## 2017-07-09 DIAGNOSIS — I420 Dilated cardiomyopathy: Secondary | ICD-10-CM | POA: Diagnosis not present

## 2017-07-09 DIAGNOSIS — I4891 Unspecified atrial fibrillation: Secondary | ICD-10-CM | POA: Diagnosis not present

## 2017-07-09 DIAGNOSIS — I509 Heart failure, unspecified: Secondary | ICD-10-CM | POA: Diagnosis not present

## 2017-07-09 DIAGNOSIS — I10 Essential (primary) hypertension: Secondary | ICD-10-CM | POA: Diagnosis not present

## 2017-07-09 DIAGNOSIS — I4892 Unspecified atrial flutter: Secondary | ICD-10-CM | POA: Diagnosis not present

## 2017-07-11 DIAGNOSIS — E039 Hypothyroidism, unspecified: Secondary | ICD-10-CM | POA: Diagnosis not present

## 2017-07-11 DIAGNOSIS — I6523 Occlusion and stenosis of bilateral carotid arteries: Secondary | ICD-10-CM | POA: Diagnosis not present

## 2017-07-11 DIAGNOSIS — I482 Chronic atrial fibrillation: Secondary | ICD-10-CM | POA: Diagnosis not present

## 2017-07-11 DIAGNOSIS — R001 Bradycardia, unspecified: Secondary | ICD-10-CM | POA: Diagnosis not present

## 2017-07-11 DIAGNOSIS — I129 Hypertensive chronic kidney disease with stage 1 through stage 4 chronic kidney disease, or unspecified chronic kidney disease: Secondary | ICD-10-CM | POA: Diagnosis not present

## 2017-07-16 DIAGNOSIS — H2513 Age-related nuclear cataract, bilateral: Secondary | ICD-10-CM | POA: Diagnosis not present

## 2017-07-23 DIAGNOSIS — I255 Ischemic cardiomyopathy: Secondary | ICD-10-CM | POA: Diagnosis not present

## 2017-07-23 DIAGNOSIS — R0602 Shortness of breath: Secondary | ICD-10-CM | POA: Diagnosis not present

## 2017-07-23 DIAGNOSIS — I10 Essential (primary) hypertension: Secondary | ICD-10-CM | POA: Diagnosis not present

## 2017-07-23 DIAGNOSIS — I4891 Unspecified atrial fibrillation: Secondary | ICD-10-CM | POA: Diagnosis not present

## 2017-07-23 DIAGNOSIS — R079 Chest pain, unspecified: Secondary | ICD-10-CM | POA: Diagnosis not present

## 2017-07-23 DIAGNOSIS — I509 Heart failure, unspecified: Secondary | ICD-10-CM | POA: Diagnosis not present

## 2017-07-23 DIAGNOSIS — I4892 Unspecified atrial flutter: Secondary | ICD-10-CM | POA: Diagnosis not present

## 2017-07-25 DIAGNOSIS — Z23 Encounter for immunization: Secondary | ICD-10-CM | POA: Diagnosis not present

## 2017-08-27 DIAGNOSIS — I4892 Unspecified atrial flutter: Secondary | ICD-10-CM | POA: Diagnosis not present

## 2017-08-27 DIAGNOSIS — I255 Ischemic cardiomyopathy: Secondary | ICD-10-CM | POA: Diagnosis not present

## 2017-08-27 DIAGNOSIS — I10 Essential (primary) hypertension: Secondary | ICD-10-CM | POA: Diagnosis not present

## 2017-08-27 DIAGNOSIS — I509 Heart failure, unspecified: Secondary | ICD-10-CM | POA: Diagnosis not present

## 2017-08-27 DIAGNOSIS — I4891 Unspecified atrial fibrillation: Secondary | ICD-10-CM | POA: Diagnosis not present

## 2017-08-27 DIAGNOSIS — R0602 Shortness of breath: Secondary | ICD-10-CM | POA: Diagnosis not present

## 2017-08-27 DIAGNOSIS — R079 Chest pain, unspecified: Secondary | ICD-10-CM | POA: Diagnosis not present

## 2017-08-29 DIAGNOSIS — I493 Ventricular premature depolarization: Secondary | ICD-10-CM | POA: Diagnosis not present

## 2017-08-29 DIAGNOSIS — I2584 Coronary atherosclerosis due to calcified coronary lesion: Secondary | ICD-10-CM | POA: Diagnosis not present

## 2017-08-29 DIAGNOSIS — I255 Ischemic cardiomyopathy: Secondary | ICD-10-CM | POA: Diagnosis not present

## 2017-08-29 DIAGNOSIS — Z8679 Personal history of other diseases of the circulatory system: Secondary | ICD-10-CM | POA: Diagnosis not present

## 2017-08-29 DIAGNOSIS — I4891 Unspecified atrial fibrillation: Secondary | ICD-10-CM | POA: Diagnosis not present

## 2017-09-05 DIAGNOSIS — I4892 Unspecified atrial flutter: Secondary | ICD-10-CM | POA: Diagnosis not present

## 2017-09-05 DIAGNOSIS — R0602 Shortness of breath: Secondary | ICD-10-CM | POA: Diagnosis not present

## 2017-09-05 DIAGNOSIS — I509 Heart failure, unspecified: Secondary | ICD-10-CM | POA: Diagnosis not present

## 2017-09-05 DIAGNOSIS — I4891 Unspecified atrial fibrillation: Secondary | ICD-10-CM | POA: Diagnosis not present

## 2017-09-05 DIAGNOSIS — I10 Essential (primary) hypertension: Secondary | ICD-10-CM | POA: Diagnosis not present

## 2017-10-10 DIAGNOSIS — I4891 Unspecified atrial fibrillation: Secondary | ICD-10-CM | POA: Diagnosis not present

## 2017-10-10 DIAGNOSIS — I10 Essential (primary) hypertension: Secondary | ICD-10-CM | POA: Diagnosis not present

## 2017-10-10 DIAGNOSIS — I509 Heart failure, unspecified: Secondary | ICD-10-CM | POA: Diagnosis not present

## 2017-10-15 ENCOUNTER — Ambulatory Visit (INDEPENDENT_AMBULATORY_CARE_PROVIDER_SITE_OTHER)
Admission: EM | Admit: 2017-10-15 | Discharge: 2017-10-15 | Disposition: A | Discharge status: Other Acute Inpatient Hospital | Payer: Medicare Other | Source: Home / Self Care | Attending: Family Medicine | Admitting: Family Medicine

## 2017-10-15 ENCOUNTER — Emergency Department
Admission: EM | Admit: 2017-10-15 | Discharge: 2017-10-15 | Disposition: A | Discharge status: Home / Self Care | Payer: Medicare Other | Attending: Student in an Organized Health Care Education/Training Program | Admitting: Student in an Organized Health Care Education/Training Program

## 2017-10-15 ENCOUNTER — Other Ambulatory Visit: Payer: Self-pay

## 2017-10-15 ENCOUNTER — Encounter: Payer: Self-pay | Admitting: Gynecology

## 2017-10-15 ENCOUNTER — Emergency Department: Payer: Medicare Other

## 2017-10-15 DIAGNOSIS — Z79899 Other long term (current) drug therapy: Secondary | ICD-10-CM | POA: Insufficient documentation

## 2017-10-15 DIAGNOSIS — R112 Nausea with vomiting, unspecified: Secondary | ICD-10-CM

## 2017-10-15 DIAGNOSIS — R1084 Generalized abdominal pain: Secondary | ICD-10-CM | POA: Diagnosis not present

## 2017-10-15 DIAGNOSIS — Z87891 Personal history of nicotine dependence: Secondary | ICD-10-CM | POA: Diagnosis not present

## 2017-10-15 DIAGNOSIS — I10 Essential (primary) hypertension: Secondary | ICD-10-CM | POA: Insufficient documentation

## 2017-10-15 DIAGNOSIS — I4891 Unspecified atrial fibrillation: Secondary | ICD-10-CM | POA: Diagnosis not present

## 2017-10-15 DIAGNOSIS — N179 Acute kidney failure, unspecified: Secondary | ICD-10-CM

## 2017-10-15 DIAGNOSIS — R5383 Other fatigue: Secondary | ICD-10-CM | POA: Insufficient documentation

## 2017-10-15 DIAGNOSIS — R1111 Vomiting without nausea: Secondary | ICD-10-CM | POA: Diagnosis not present

## 2017-10-15 DIAGNOSIS — D72829 Elevated white blood cell count, unspecified: Secondary | ICD-10-CM | POA: Diagnosis not present

## 2017-10-15 DIAGNOSIS — R799 Abnormal finding of blood chemistry, unspecified: Secondary | ICD-10-CM | POA: Diagnosis not present

## 2017-10-15 DIAGNOSIS — R634 Abnormal weight loss: Secondary | ICD-10-CM | POA: Insufficient documentation

## 2017-10-15 DIAGNOSIS — Z7982 Long term (current) use of aspirin: Secondary | ICD-10-CM | POA: Insufficient documentation

## 2017-10-15 DIAGNOSIS — I2510 Atherosclerotic heart disease of native coronary artery without angina pectoris: Secondary | ICD-10-CM | POA: Diagnosis not present

## 2017-10-15 HISTORY — DX: Disorder of kidney and ureter, unspecified: N28.9

## 2017-10-15 HISTORY — DX: Atherosclerotic heart disease of native coronary artery without angina pectoris: I25.10

## 2017-10-15 HISTORY — DX: Essential (primary) hypertension: I10

## 2017-10-15 HISTORY — DX: Disorder of thyroid, unspecified: E07.9

## 2017-10-15 LAB — URINALYSIS, COMPLETE (UACMP) WITH MICROSCOPIC
BACTERIA UA: NONE SEEN
BILIRUBIN URINE: NEGATIVE
GLUCOSE, UA: NEGATIVE mg/dL
HGB URINE DIPSTICK: NEGATIVE
KETONES UR: NEGATIVE mg/dL
LEUKOCYTES UA: NEGATIVE
Nitrite: NEGATIVE
PROTEIN: 30 mg/dL — AB
Specific Gravity, Urine: 1.021 (ref 1.005–1.030)
pH: 5 (ref 5.0–8.0)

## 2017-10-15 LAB — CBC WITH DIFFERENTIAL/PLATELET
Basophils Absolute: 0.1 10*3/uL (ref 0–0.1)
Basophils Relative: 1 %
EOS PCT: 0 %
Eosinophils Absolute: 0 10*3/uL (ref 0–0.7)
HEMATOCRIT: 46.8 % (ref 40.0–52.0)
Hemoglobin: 15.3 g/dL (ref 13.0–18.0)
LYMPHS ABS: 2.1 10*3/uL (ref 1.0–3.6)
LYMPHS PCT: 9 %
MCH: 29.7 pg (ref 26.0–34.0)
MCHC: 32.6 g/dL (ref 32.0–36.0)
MCV: 91.2 fL (ref 80.0–100.0)
MONO ABS: 1.7 10*3/uL — AB (ref 0.2–1.0)
Monocytes Relative: 7 %
NEUTROS PCT: 83 %
Neutro Abs: 20.7 10*3/uL — ABNORMAL HIGH (ref 1.4–6.5)
PLATELETS: 244 10*3/uL (ref 150–440)
RBC: 5.13 MIL/uL (ref 4.40–5.90)
RDW: 15.1 % — ABNORMAL HIGH (ref 11.5–14.5)
WBC: 24.6 10*3/uL — AB (ref 3.8–10.6)

## 2017-10-15 LAB — COMPREHENSIVE METABOLIC PANEL
ALT: 67 U/L — AB (ref 17–63)
AST: 111 U/L — ABNORMAL HIGH (ref 15–41)
Albumin: 3.2 g/dL — ABNORMAL LOW (ref 3.5–5.0)
Alkaline Phosphatase: 149 U/L — ABNORMAL HIGH (ref 38–126)
Anion gap: 11 (ref 5–15)
BUN: 26 mg/dL — ABNORMAL HIGH (ref 6–20)
CALCIUM: 8.5 mg/dL — AB (ref 8.9–10.3)
CHLORIDE: 103 mmol/L (ref 101–111)
CO2: 25 mmol/L (ref 22–32)
CREATININE: 2.58 mg/dL — AB (ref 0.61–1.24)
GFR, EST AFRICAN AMERICAN: 27 mL/min — AB (ref >60)
GFR, EST NON AFRICAN AMERICAN: 23 mL/min — AB (ref >60)
Glucose, Bld: 151 mg/dL — ABNORMAL HIGH (ref 65–99)
Potassium: 4.5 mmol/L (ref 3.5–5.1)
Sodium: 139 mmol/L (ref 135–145)
Total Bilirubin: 0.9 mg/dL (ref 0.3–1.2)
Total Protein: 7.7 g/dL (ref 6.5–8.1)

## 2017-10-15 LAB — LIPASE, BLOOD: Lipase: 27 U/L (ref 11–51)

## 2017-10-15 MED ORDER — SODIUM CHLORIDE 0.9 % IV BOLUS (SEPSIS)
1000.0000 mL | Freq: Once | INTRAVENOUS | Status: AC
  Administered 2017-10-15: 1000 mL via INTRAVENOUS

## 2017-10-15 MED ORDER — ONDANSETRON HCL 4 MG PO TABS: 4.0000 mg | ORAL_TABLET | Freq: Every day | ORAL | 0 refills | Status: DC | PRN

## 2017-10-15 NOTE — ED Provider Notes (Signed)
[facility]  CSN: [id number] Arrival date & time: 10/15/17  1342  History   Chief Complaint Chief Complaint  Patient presents with  . Emesis  . Fatigue   HPI  71 year old male with a history of coronary artery disease, carotid stenosis, PAD, hypertension presents with a 2-week history of nausea, vomiting, fatigue, and weight loss.  The patient reports that he has had a 2-week history of nausea and vomiting.  He states that it tends to occur approximately once a day after a meal.  He states that he feels that he is lost approximately 10 pounds in the past 2 weeks.  No reports of fevers or chills.  He does report some vague abdominal pain but cannot localize it.  He states that his cardiac medicines have recently been changed but he was unable to afford it.  He is not currently taking aspirin, plavix or Xarelto.  No reports of abdominal pain.  He states that he is fatigued.  He just does not feel well.  No other associated symptoms.  No other complaints at this time.  Past Medical History:  Diagnosis Date  . Coronary artery disease   . Hypertension   . Renal disorder   . Thyroid disease    Patient Active Problem List   Diagnosis Date Noted  . Carotid stenosis 06/17/2017  . Atherosclerosis of artery of extremity with intermittent claudication ([place]) 06/17/2017  . A-fib ([place]) 06/17/2017  . Essential hypertension 06/17/2017  . CAD (coronary artery disease) 06/17/2017   History reviewed. No pertinent surgical history.  Home Medications    Prior to Admission medications   Medication Sig Start Date End Date Taking? Authorizing Provider  amiodarone (PACERONE) 200 MG tablet  03/21/17  Yes [provider]  aspirin EC 81 MG tablet Take by mouth.   Yes [provider]  atorvastatin (LIPITOR) 40 MG tablet  04/28/17  Yes [provider]  isosorbide mononitrate (IMDUR) 30 MG 24 hr tablet  04/28/17  Yes [provider]  levothyroxine (SYNTHROID,  LEVOTHROID) 75 MCG tablet  04/08/17  Yes [provider]  lisinopril (PRINIVIL,ZESTRIL) 5 MG tablet  04/28/17  Yes [provider]  Rivaroxaban (XARELTO) 15 MG TABS tablet Take 15 mg by mouth 2 (two) times daily with a meal.   Yes [provider]  amLODipine (NORVASC) 5 MG tablet  04/09/17   [provider]  atorvastatin (LIPITOR) 40 MG tablet  07/31/15   [provider]  clopidogrel (PLAVIX) 75 MG tablet  03/21/17   [provider]    Family History Family History  Problem Relation Age of Onset  . Pneumonia Father     Social History Social History   Tobacco Use  . Smoking status: Former Research scientist (life sciences)  . Smokeless tobacco: Never Used  . Tobacco comment: 10 years ago  Substance Use Topics  . Alcohol use: No    Frequency: Never  . Drug use: No     Allergies   Patient has no known allergies.   Review of Systems Review of Systems  Constitutional: Positive for fatigue and unexpected weight change.  Respiratory: Negative.   Cardiovascular: Negative.   Gastrointestinal: Positive for nausea and vomiting. Negative for abdominal pain.  All other systems reviewed and are negative.  Physical Exam Triage Vital Signs ED Triage Vitals [10/15/17 1549]  Enc Vitals Group     BP 111/68     Pulse Rate 60     Resp 16     Temp 97.7  F (36.5 C)     Temp Source Oral     SpO2 100 %     Weight 176 lb (79.8 kg)     Height 6' 1"  (1.854 m)     Head Circumference      Peak Flow      Pain Score      Pain Loc      Pain Edu?      Excl. in [place]?    Updated Vital Signs BP 111/68 (BP Location: Left Arm)   Pulse 60   Temp 97.7 F (36.5 C) (Oral)   Resp 16   Ht 6' 1"  (1.854 m)   Wt 176 lb (79.8 kg)   SpO2 100%   BMI 23.22 kg/m   Physical Exam  Constitutional: He is oriented to person, place, and time. He appears well-developed. No distress.  HENT:  Head: Normocephalic and atraumatic.  Eyes: Conjunctivae are normal.  Cardiovascular: Normal  rate and regular rhythm.  No murmur heard. Pulmonary/Chest: Effort normal and breath sounds normal. He has no wheezes. He has no rales.  Abdominal:  Soft, nondistended.  Palpable pulse in the mid abdomen.  Likely has an underlying aortic aneurysm.  Neurological: He is alert and oriented to person, place, and time.  Skin: Skin is warm. No rash noted.  Dry skin.  Psychiatric: He has a normal mood and affect. His behavior is normal.  Vitals reviewed.  UC Treatments / Results  Labs (all labs ordered are listed, but only abnormal results are displayed) Labs Reviewed  COMPREHENSIVE METABOLIC PANEL - Abnormal; Notable for the following components:      Result Value   Glucose, Bld 151 (*)    BUN 26 (*)    Creatinine, Ser 2.58 (*)    Calcium 8.5 (*)    Albumin 3.2 (*)    AST 111 (*)    ALT 67 (*)    Alkaline Phosphatase 149 (*)    GFR calc non Af Amer 23 (*)    GFR calc Af Amer 27 (*)    All other components within normal limits  CBC WITH DIFFERENTIAL/PLATELET - Abnormal; Notable for the following components:   WBC 24.6 (*)    RDW 15.1 (*)    Neutro Abs 20.7 (*)    Monocytes Absolute 1.7 (*)    All other components within normal limits    EKG  EKG Interpretation None       Radiology No results found.  Procedures Procedures (including critical care time)  Medications Ordered in UC Medications - No data to display   Initial Impression / Assessment and Plan / UC Course  I have reviewed the triage vital signs and the nursing notes.  Pertinent labs & imaging results that were available during my care of the patient were reviewed by me and considered in my medical decision making (see chart for details).     71 year old male presents with nausea, vomiting, weight loss.  Laboratory studies done and revealed elevated creatinine, AST and ALT elevation, alk phos elevation, and leukocytosis with left shift.  WBC count markedly elevated at 24.6.  Patient surprisingly appears  stable and well.  I advised him that he needs to go straight to the emergency department for further evaluation given his symptoms and laboratory abnormalities.  Patient does not want to go via ambulance.  I advised him to go via his own vehicle immediately.  Patient in agreement.  [facility] notified.  Final Clinical Impressions(s) / UC Diagnoses  Final diagnoses:  Nausea and vomiting, intractability of vomiting not specified, unspecified vomiting type  Leukocytosis, unspecified type  AKI (acute kidney injury) [facility])    ED Discharge Orders    None     Controlled Substance Prescriptions [place] Controlled Substance Registry consulted? Not Applicable   Coral Spikes, DO 10/15/17 1722

## 2017-10-15 NOTE — ED Notes (Signed)
Pt sent from [facility] urgent care for eval.  Pt reports vomiting for the past 2 weeks.  Pt vomited x 1 today at noon.  decreased appetite.     No diarrhea.  No abd pain.  Pt denies chest pain or sob.  No back pain.  Denies urinary sx.  Pt alert.   Speech clear.

## 2017-10-15 NOTE — ED Notes (Signed)
ED Provider at bedside. 

## 2017-10-15 NOTE — ED Provider Notes (Signed)
[facility] Emergency Department Provider Note    First MD Initiated Contact with Patient 10/15/17 2145     (approximate)  I have reviewed the triage vital signs and the nursing notes.   HISTORY  Chief Complaint abnormal labs    HPI Alan Bennett is a 71 y.o. male with a history of CAD as well as chronic renal disease who presents with 3 months of slow weight loss with recurrent nausea and vomiting.  Patient states that he can get part way through a meal and then feels that he is choking up on the food.  Denies any fevers.  Denies any chest pain.  Denies any abdominal pain.  Still moving his bowels.  No diarrhea.  No recent fevers.  Symptoms did start when he was recently increased on his amiodarone dosing.  Patient was seen at urgent care today and had blood work done that showed leukocytosis so was sent to the ER for further evaluation.  States that he feels fine and in no acute distress.  Past Medical History:  Diagnosis Date  . Coronary artery disease   . Hypertension   . Renal disorder   . Thyroid disease    Family History  Problem Relation Age of Onset  . Pneumonia Father    History reviewed. No pertinent surgical history. Patient Active Problem List   Diagnosis Date Noted  . Carotid stenosis 06/17/2017  . Atherosclerosis of artery of extremity with intermittent claudication ([place]) 06/17/2017  . A-fib ([place]) 06/17/2017  . Essential hypertension 06/17/2017  . CAD (coronary artery disease) 06/17/2017      Prior to Admission medications   Medication Sig Start Date End Date Taking? Authorizing Provider  amiodarone (PACERONE) 200 MG tablet  03/21/17   [provider]  amLODipine (NORVASC) 5 MG tablet  04/09/17   [provider]  aspirin EC 81 MG tablet Take by mouth.    [provider]  atorvastatin (LIPITOR) 40 MG tablet  04/28/17   [provider]  atorvastatin (LIPITOR) 40 MG tablet  07/31/15   [provider]  clopidogrel (PLAVIX) 75 MG tablet  03/21/17   [provider]  isosorbide mononitrate (IMDUR) 30 MG 24 hr tablet  04/28/17   [provider]  levothyroxine (SYNTHROID, LEVOTHROID) 75 MCG tablet  04/08/17   [provider]  lisinopril (PRINIVIL,ZESTRIL) 5 MG tablet  04/28/17   [provider]  ondansetron (ZOFRAN) 4 MG tablet Take 1 tablet (4 mg total) by mouth daily as needed for nausea or vomiting. 10/15/17 10/15/18  Merlyn Lot, MD  Rivaroxaban (XARELTO) 15 MG TABS tablet Take 15 mg by mouth 2 (two) times daily with a meal.    [provider]    Allergies Patient has no known allergies.    Social History Social History   Tobacco Use  . Smoking status: Former Research scientist (life sciences)  . Smokeless tobacco: Never Used  . Tobacco comment: 10 years ago  Substance Use Topics  . Alcohol use: No    Frequency: Never  . Drug use: No    Review of Systems Patient denies headaches, rhinorrhea, blurry vision, numbness, shortness of breath, chest pain, edema, cough, abdominal pain, nausea, vomiting, diarrhea, dysuria, fevers, rashes or hallucinations unless otherwise stated above in HPI. ____________________________________________   PHYSICAL EXAM:  VITAL SIGNS: Vitals:   10/15/17 2230 10/15/17 2300  BP: (!) 144/56 (!) 143/53  Pulse: (!) 48 (!) 50  Resp: 11 15  Temp:    SpO2:  97% 97%    Constitutional: Alert and oriented. Well appearing and in no acute distress. Eyes: Conjunctivae are normal.  Head: Atraumatic. Nose: No congestion/rhinnorhea. Mouth/Throat: Mucous membranes are moist.   Neck: No stridor. Painless ROM.  Cardiovascular: Normal rate, regular rhythm. Grossly normal heart sounds.  Good peripheral circulation. Respiratory: Normal respiratory effort.  No retractions. Lungs CTAB. Gastrointestinal: Soft and nontender. No distention. No abdominal bruits. No CVA tenderness. Genitourinary:  Musculoskeletal: No lower extremity  tenderness nor edema.  No joint effusions. Neurologic:  Normal speech and language. No gross focal neurologic deficits are appreciated. No facial droop Skin:  Skin is warm, dry and intact. No rash noted. Psychiatric: Mood and affect are normal. Speech and behavior are normal.  ____________________________________________   LABS (all labs ordered are listed, but only abnormal results are displayed)  Results for orders placed or performed during the hospital encounter of 10/15/17 (from the past 24 hour(s))  Lipase, blood     Status: None   Collection Time: 10/15/17  7:49 PM  Result Value Ref Range   Lipase 27 11 - 51 U/L  Urinalysis, Complete w Microscopic     Status: Abnormal   Collection Time: 10/15/17  7:49 PM  Result Value Ref Range   Color, Urine AMBER (A) YELLOW   APPearance HAZY (A) CLEAR   Specific Gravity, Urine 1.021 1.005 - 1.030   pH 5.0 5.0 - 8.0   Glucose, UA NEGATIVE NEGATIVE mg/dL   Hgb urine dipstick NEGATIVE NEGATIVE   Bilirubin Urine NEGATIVE NEGATIVE   Ketones, ur NEGATIVE NEGATIVE mg/dL   Protein, ur 30 (A) NEGATIVE mg/dL   Nitrite NEGATIVE NEGATIVE   Leukocytes, UA NEGATIVE NEGATIVE   RBC / HPF 0-5 0 - 5 RBC/hpf   WBC, UA 6-30 0 - 5 WBC/hpf   Bacteria, UA NONE SEEN NONE SEEN   Squamous Epithelial / LPF 0-5 (A) NONE SEEN   Mucus PRESENT    Hyaline Casts, UA PRESENT    ____________________________________________  ____________________________________________  RADIOLOGY  I personally reviewed all radiographic images ordered to evaluate for the above acute complaints and reviewed radiology reports and findings.  These findings were personally discussed with the patient.  Please see medical record for radiology report.  ____________________________________________   PROCEDURES  Procedure(s) performed:  Procedures    Critical Care performed: no ____________________________________________   INITIAL IMPRESSION / ASSESSMENT AND PLAN / ED  COURSE  Pertinent labs & imaging results that were available during my care of the patient were reviewed by me and considered in my medical decision making (see chart for details).  DDX: Mass, pancreatitis, dehydration, hepatitis, amiodarone toxicity, CLL, sepsis  Andrzej Scully is a 71 y.o. who presents to the ED with symptoms as described above.  Patient well-appearing.  Patient does have leukocytosis noted.  No evidence of acidosis.  Renal function is at baseline.  Mild elevation of his LFTs.  His abdominal exam is soft and benign.  CT imaging is reassuring.  Patient denies any deficits or discomfort.  Despite there is some component of amiodarone toxicity based on correlation of symptoms with increasing his dose.  Regarding leukocytosis seems less consistent with infectious process.  Probable CLL.  Will give referral to oncology.  Patient will be given referral to PCP.  Discussed signs and symptoms for which he should return immediately to the hospital.      ____________________________________________   FINAL CLINICAL IMPRESSION(S) / ED DIAGNOSES  Final diagnoses:  Non-intractable vomiting with nausea, unspecified vomiting type  Leukocytosis, unspecified type      NEW MEDICATIONS STARTED DURING THIS VISIT:  This SmartLink is deprecated. Use AVSMEDLIST instead to display the medication list for a patient.   Note:  This document was prepared using Dragon voice recognition software and may include unintentional dictation errors.    Merlyn Lot, MD 10/15/17 [phone]

## 2017-10-15 NOTE — ED Triage Notes (Signed)
Per patient had a change in his blood thinner medication when he start having nausea and loss of appetite. Patient stated vomit x today after eating some potato soup.

## 2017-10-15 NOTE — ED Triage Notes (Signed)
Pt arrives to ED via POV from [facility] with c/o elevated WBC count. Pt reports being seen at [facility] for N/V x2-3 weeks and sent here following abnormal lab results. Pt reports 2 episodes of emesis in the last 24 hrs; no diarrhea. No c/o CP, abdominal pain, or SHOB. Pt's wife reports "significant weight loss in the last month".

## 2017-10-15 NOTE — Discharge Instructions (Signed)
Go straight to the ER. ° °Take care ° °Dr. Patsy Varma  °

## 2017-10-21 ENCOUNTER — Ambulatory Visit: Payer: Medicare Other | Admitting: Oncology

## 2017-10-24 ENCOUNTER — Inpatient Hospital Stay: Payer: Medicare Other

## 2017-10-24 ENCOUNTER — Inpatient Hospital Stay: Payer: Medicare Other | Attending: Oncology | Admitting: Oncology

## 2017-10-24 ENCOUNTER — Other Ambulatory Visit: Payer: Self-pay | Admitting: *Deleted

## 2017-10-24 ENCOUNTER — Other Ambulatory Visit: Payer: Self-pay

## 2017-10-24 ENCOUNTER — Encounter: Payer: Self-pay | Admitting: Oncology

## 2017-10-24 VITALS — BP 144/61 | HR 41 | Temp 98.0°F | Wt 153.7 lb

## 2017-10-24 DIAGNOSIS — I10 Essential (primary) hypertension: Secondary | ICD-10-CM | POA: Insufficient documentation

## 2017-10-24 DIAGNOSIS — E079 Disorder of thyroid, unspecified: Secondary | ICD-10-CM | POA: Diagnosis not present

## 2017-10-24 DIAGNOSIS — I4891 Unspecified atrial fibrillation: Secondary | ICD-10-CM | POA: Diagnosis not present

## 2017-10-24 DIAGNOSIS — Z79899 Other long term (current) drug therapy: Secondary | ICD-10-CM | POA: Diagnosis not present

## 2017-10-24 DIAGNOSIS — R112 Nausea with vomiting, unspecified: Secondary | ICD-10-CM

## 2017-10-24 DIAGNOSIS — D72829 Elevated white blood cell count, unspecified: Secondary | ICD-10-CM | POA: Diagnosis not present

## 2017-10-24 DIAGNOSIS — R1310 Dysphagia, unspecified: Secondary | ICD-10-CM | POA: Diagnosis not present

## 2017-10-24 DIAGNOSIS — R634 Abnormal weight loss: Secondary | ICD-10-CM | POA: Insufficient documentation

## 2017-10-24 DIAGNOSIS — I2510 Atherosclerotic heart disease of native coronary artery without angina pectoris: Secondary | ICD-10-CM | POA: Insufficient documentation

## 2017-10-24 DIAGNOSIS — Z87891 Personal history of nicotine dependence: Secondary | ICD-10-CM | POA: Insufficient documentation

## 2017-10-24 LAB — CBC WITH DIFFERENTIAL/PLATELET
BASOS ABS: 0.1 10*3/uL (ref 0–0.1)
Basophils Relative: 1 %
EOS ABS: 0.3 10*3/uL (ref 0–0.7)
EOS PCT: 2 %
HCT: 38.8 % — ABNORMAL LOW (ref 40.0–52.0)
Hemoglobin: 12.8 g/dL — ABNORMAL LOW (ref 13.0–18.0)
LYMPHS ABS: 1.7 10*3/uL (ref 1.0–3.6)
LYMPHS PCT: 9 %
MCH: 29.7 pg (ref 26.0–34.0)
MCHC: 33 g/dL (ref 32.0–36.0)
MCV: 90 fL (ref 80.0–100.0)
MONO ABS: 1.1 10*3/uL — AB (ref 0.2–1.0)
Monocytes Relative: 6 %
Neutro Abs: 14.4 10*3/uL — ABNORMAL HIGH (ref 1.4–6.5)
Neutrophils Relative %: 82 %
PLATELETS: 246 10*3/uL (ref 150–440)
RBC: 4.31 MIL/uL — AB (ref 4.40–5.90)
RDW: 14.8 % — AB (ref 11.5–14.5)
WBC: 17.5 10*3/uL — AB (ref 3.8–10.6)

## 2017-10-24 LAB — COMPREHENSIVE METABOLIC PANEL
ALK PHOS: 138 U/L — AB (ref 38–126)
ALT: 49 U/L (ref 17–63)
AST: 76 U/L — AB (ref 15–41)
Albumin: 2.8 g/dL — ABNORMAL LOW (ref 3.5–5.0)
Anion gap: 8 (ref 5–15)
BUN: 21 mg/dL — AB (ref 6–20)
CALCIUM: 7.5 mg/dL — AB (ref 8.9–10.3)
CO2: 28 mmol/L (ref 22–32)
CREATININE: 2.07 mg/dL — AB (ref 0.61–1.24)
Chloride: 103 mmol/L (ref 101–111)
GFR, EST AFRICAN AMERICAN: 35 mL/min — AB (ref >60)
GFR, EST NON AFRICAN AMERICAN: 30 mL/min — AB (ref >60)
Glucose, Bld: 96 mg/dL (ref 65–99)
Potassium: 4.2 mmol/L (ref 3.5–5.1)
Sodium: 139 mmol/L (ref 135–145)
Total Bilirubin: 1 mg/dL (ref 0.3–1.2)
Total Protein: 6.5 g/dL (ref 6.5–8.1)

## 2017-10-24 LAB — LACTATE DEHYDROGENASE: LDH: 157 U/L (ref 98–192)

## 2017-10-24 MED ORDER — ONDANSETRON HCL 4 MG PO TABS
4.0000 mg | ORAL_TABLET | Freq: Two times a day (BID) | ORAL | 0 refills | Status: DC | PRN
Start: 2017-10-24 — End: 2017-12-17

## 2017-10-24 NOTE — Progress Notes (Signed)
Patient here today as a new patient  

## 2017-10-24 NOTE — Progress Notes (Signed)
Hematology/Oncology Consult note [facility] [phone] [phone]   Patient Care Team: Lavera Guise, MD as PCP - General (Internal Medicine)  REFERRING PROVIDER: Merlyn Lot CHIEF COMPLAINTS/PURPOSE OF CONSULTATION:  Leukocytosis  HISTORY OF PRESENTING ILLNESS:  Alan Bennett is a  71 y.o.  male with PMH listed below who was referred to me for evaluation of leukocytosis.  Patient presented to ER for evaluation of 3 months weight loss, nausea and vomiting. He had blood work done which showed leukocytosis, predominantly neutrophilia and monocytosis.  Patient was refer to me for further evaluation. Patient reports 4-6 weeks of feeling food stuck in the throat which triggers vomiting. He also has had a 15 pound weight loss over the 3 months. Denies any cough, shortness of breath. He is a former smoker, quit about 10 years ago. Denies any alcohol use. He has history of atrial fibrillation. Wife reports there is some confusion about his anticoagulation. He used to take aspirin and Plavix. Recently he was told to stop aspirin and Plavix and start Evalina Field which was never started to as patient has CKD. He has appointment to see cardiologist next week  He takes Zofran as needed which he got from the emergency room. He requests to have refilled as Zofran helps with the nausea symptom.  Review of Systems  Constitutional: Positive for weight loss. Negative for fever.  HENT: Negative for hearing loss.   Eyes: Negative for blurred vision.  Respiratory: Negative for cough.   Cardiovascular: Negative for chest pain and orthopnea.  Gastrointestinal: Positive for nausea and vomiting. Negative for abdominal pain and heartburn.  Genitourinary: Negative for dysuria and frequency.  Musculoskeletal: Negative for myalgias.  Skin: Negative for rash.  Neurological: Negative for dizziness.  Endo/Heme/Allergies: Does not bruise/bleed easily.    Psychiatric/Behavioral: Negative for depression.    MEDICAL HISTORY:  Past Medical History:  Diagnosis Date  . Coronary artery disease   . Hypertension   . Renal disorder   . Thyroid disease     SURGICAL HISTORY: No past surgical history on file.  SOCIAL HISTORY: Social History   Socioeconomic History  . Marital status: Married    Spouse name: Not on file  . Number of children: Not on file  . Years of education: Not on file  . Highest education level: Not on file  Social Needs  . Financial resource strain: Not on file  . Food insecurity - worry: Not on file  . Food insecurity - inability: Not on file  . Transportation needs - medical: Not on file  . Transportation needs - non-medical: Not on file  Occupational History  . Not on file  Tobacco Use  . Smoking status: Former Research scientist (life sciences)  . Smokeless tobacco: Never Used  . Tobacco comment: 10 years ago  Substance and Sexual Activity  . Alcohol use: No    Frequency: Never  . Drug use: No  . Sexual activity: Not on file  Other Topics Concern  . Not on file  Social History Narrative  . Not on file    FAMILY HISTORY: Family History  Problem Relation Age of Onset  . Pneumonia Father     ALLERGIES:  has No Known Allergies.  MEDICATIONS:  Current Outpatient Medications  Medication Sig Dispense Refill  . amiodarone (PACERONE) 200 MG tablet     . amLODipine (NORVASC) 5 MG tablet     . aspirin EC 81 MG tablet Take by mouth.    Marland Kitchen atorvastatin (LIPITOR) 40 MG  tablet     . atorvastatin (LIPITOR) 40 MG tablet     . clopidogrel (PLAVIX) 75 MG tablet     . isosorbide mononitrate (IMDUR) 30 MG 24 hr tablet     . levothyroxine (SYNTHROID, LEVOTHROID) 75 MCG tablet     . lisinopril (PRINIVIL,ZESTRIL) 5 MG tablet     . ondansetron (ZOFRAN) 4 MG tablet Take 1 tablet (4 mg total) by mouth daily as needed for nausea or vomiting. 14 tablet 0  . Rivaroxaban (XARELTO) 15 MG TABS tablet Take 15 mg by mouth 2 (two) times daily with a  meal.     No current facility-administered medications for this visit.      PHYSICAL EXAMINATION: ECOG PERFORMANCE STATUS: 1 - Symptomatic but completely ambulatory Vitals:   10/24/17 0849  BP: (!) 144/61  Pulse: (!) 41  Temp: 98 F (36.7 C)   Filed Weights   10/24/17 0849  Weight: 153 lb 10.6 oz (69.7 kg)    Physical Exam  Constitutional: He is oriented to person, place, and time. No distress.  Thin.   HENT:  Head: Normocephalic and atraumatic.  Mouth/Throat: No oropharyngeal exudate.  Eyes: EOM are normal. Pupils are equal, round, and reactive to light. Right eye exhibits no discharge.  Neck: Normal range of motion. Neck supple. No JVD present.  Questionable left supraclavicular fullness.  Cardiovascular: Normal rate and regular rhythm.  No murmur heard. Pulmonary/Chest: Effort normal and breath sounds normal. No respiratory distress.  Abdominal: Soft. Bowel sounds are normal. He exhibits no distension.  Musculoskeletal: Normal range of motion. He exhibits no edema.  Lymphadenopathy:    He has no cervical adenopathy.  Neurological: He is alert and oriented to person, place, and time. He displays normal reflexes.  Skin: Skin is warm and dry.  Psychiatric: Affect normal.     LABORATORY DATA:  I have reviewed the data as listed Lab Results  Component Value Date   WBC 24.6 (H) 10/15/2017   HGB 15.3 10/15/2017   HCT 46.8 10/15/2017   MCV 91.2 10/15/2017   PLT 244 10/15/2017   Recent Labs    10/15/17 1628  NA 139  K 4.5  CL 103  CO2 25  GLUCOSE 151*  BUN 26*  CREATININE 2.58*  CALCIUM 8.5*  GFRNONAA 23*  GFRAA 27*  PROT 7.7  ALBUMIN 3.2*  AST 111*  ALT 67*  ALKPHOS 149*  BILITOT 0.9       ASSESSMENT & PLAN:  1. Leukocytosis, unspecified type   2. Nausea and vomiting, intractability of vomiting not specified, unspecified vomiting type   3. Weight loss    Leukocytosis with predominant neutrophilia and monocytosis, reactive to acute infection,  vs myeloproliferative disease. Will repeat CBC with differential, flowcytometry, LDH, BCR-Abl.   # Concerning nausea vomiting, swallowing difficulty, and weight loss. Referred to GI Dr. Vicente Males for evaluation. Check hepatitis panel. 2 weeks All questions were answered. The patient knows to call the clinic with any problems questions or concerns.  Return of visit:  Thank you for this kind referral and the opportunity to participate in the care of this patient. A copy of today's note is routed to referring provider    Earlie Server, MD, PhD Hematology Oncology [facility] at [facility] Pager- [phone] 10/24/2017

## 2017-10-25 ENCOUNTER — Ambulatory Visit: Payer: Medicare Other | Admitting: Oncology

## 2017-10-25 LAB — HEPATITIS PANEL, ACUTE
HCV Ab: <0.1 s/co ratio (ref 0.0–0.9)
HEP B S AG: NEGATIVE
Hep A IgM: NEGATIVE
Hep B C IgM: NEGATIVE

## 2017-10-28 LAB — COMP PANEL: LEUKEMIA/LYMPHOMA

## 2017-10-31 DIAGNOSIS — I4891 Unspecified atrial fibrillation: Secondary | ICD-10-CM | POA: Diagnosis not present

## 2017-11-06 LAB — BCR-ABL1 FISH
CELLS ANALYZED: 200
CELLS COUNTED: 200
PDF: 0

## 2017-11-06 NOTE — Progress Notes (Addendum)
Hematology/Oncology Follow up note [facility] [phone] [phone]   Patient Care Team: Lavera Guise, MD as PCP - General (Internal Medicine)  REFERRING PROVIDER: Merlyn Lot CHIEF COMPLAINTS/PURPOSE OF CONSULTATION:  Leukocytosis  HISTORY OF PRESENTING ILLNESS:  Alan Bennett is a  72 y.o.  male with PMH listed below who was referred to me for evaluation of leukocytosis.  Patient presented to ER for evaluation of 3 months weight loss, nausea and vomiting. He had blood work done which showed leukocytosis, predominantly neutrophilia and monocytosis.  Patient was refer to me for further evaluation. Patient reports 4-6 weeks of feeling food stuck in the throat which triggers vomiting. He also has had a 15 pound weight loss over the 3 months. Denies any cough, shortness of breath. He is a former smoker, quit about 10 years ago. Denies any alcohol use. He has history of atrial fibrillation. Wife reports there is some confusion about his anticoagulation. He used to take aspirin and Plavix. Recently he was told to stop aspirin and Plavix and start Evalina Field which was never started to as patient has CKD. He has appointment to see cardiologist next week  He takes Zofran as needed which he got from the emergency room. He requests to have refilled as Zofran helps with the nausea symptom.  INTERVAL HISTORY Alan Bennett is a 72 y.o. male who has above history reviewed by me today presents for follow up visit for management of leukocytosis, weight loss, nausea.   Patient continues to feel nausea, lack of appetite. He takes Zofran as needed which helps with the symptom. He has significant weight loss since August. His weight has been stable since his last visit on 10/24/2017. He weighs 153 pounds today. Feels weak. Wife accompany patient to today's visit. He is seeing a new cardiologist or recommend to start him on Eliquis for anticoagulation for his  atrial fibrillation. [facility] is [facility] if [facility] has any [facility] for the Eliquis. Denies any bleeding events, blood in the stool or melena. He was referred to see GI Dr. Vicente Males for evaluation of his nausea and vomiting with loss and sensation of food stuck in the throat. He tells me today that he got a call from GI office but when he calls back, no answer. After trying a few times, he stopped calling.  Review of Systems  Constitutional: Positive for weight loss. Negative for fever.  HENT: Negative for hearing loss.   Eyes: Negative for blurred vision.  Respiratory: Negative for cough.   Cardiovascular: Negative for chest pain and orthopnea.  Gastrointestinal: Positive for nausea and vomiting. Negative for abdominal pain and heartburn.  Genitourinary: Negative for dysuria and frequency.  Musculoskeletal: Negative for myalgias.  Skin: Negative for rash.  Neurological: Negative for dizziness.  Endo/Heme/Allergies: Does not bruise/bleed easily.  Psychiatric/Behavioral: Negative for depression.    MEDICAL HISTORY:  Past Medical History:  Diagnosis Date  . Coronary artery disease   . Hypertension   . Renal disorder   . Thyroid disease     SURGICAL HISTORY: No past surgical history on file.  SOCIAL HISTORY: Social History   Socioeconomic History  . Marital status: Married    Spouse name: Not on file  . Number of children: Not on file  . Years of education: Not on file  . Highest education level: Not on file  Social Needs  . Financial resource strain: Not on file  . Food insecurity - worry: Not  on file  . Food insecurity - inability: Not on file  . Transportation needs - medical: Not on file  . Transportation needs - non-medical: Not on file  Occupational History  . Not on file  Tobacco Use  . Smoking status: Former Smoker    Years: 20.00  . Smokeless tobacco: Never Used  . Tobacco comment: 10 years ago  Substance and Sexual Activity  . Alcohol use: No     Frequency: Never  . Drug use: No  . Sexual activity: Not on file  Other Topics Concern  . Not on file  Social History Narrative  . Not on file    FAMILY HISTORY: Family History  Problem Relation Age of Onset  . Pneumonia Father   . Heart attack Brother     ALLERGIES:  has No Known Allergies.  MEDICATIONS:  Current Outpatient Medications  Medication Sig Dispense Refill  . amiodarone (PACERONE) 200 MG tablet     . atorvastatin (LIPITOR) 40 MG tablet     . isosorbide mononitrate (IMDUR) 30 MG 24 hr tablet     . levothyroxine (SYNTHROID, LEVOTHROID) 75 MCG tablet     . lisinopril (PRINIVIL,ZESTRIL) 5 MG tablet     . Omega-3 Fatty Acids (FISH OIL) 1000 MG CAPS Take 1,000 mg by mouth daily.    . ondansetron (ZOFRAN) 4 MG tablet Take 1 tablet (4 mg total) by mouth every 12 (twelve) hours as needed for nausea or vomiting. 60 tablet 0   No current facility-administered medications for this visit.      PHYSICAL EXAMINATION: ECOG PERFORMANCE STATUS: 1 - Symptomatic but completely ambulatory Vitals:   11/07/17 0900 11/07/17 0902  BP:  140/64  Pulse:  (!) 44  Resp: 16   Temp: (!) 97.2 F (36.2 C)    Filed Weights   11/07/17 0858  Weight: 153 lb (69.4 kg)    Physical Exam  Constitutional: He is oriented to person, place, and time. No distress.  Thin.   HENT:  Head: Normocephalic and atraumatic.  Mouth/Throat: No oropharyngeal exudate.  Eyes: EOM are normal. Pupils are equal, round, and reactive to light. Right eye exhibits no discharge.  Neck: Normal range of motion. Neck supple. No JVD present. No thyromegaly present.  Questionable left supraclavicular fullness.  Cardiovascular: Regular rhythm.  No murmur heard. Irregular heart beats, bradycardia.   Pulmonary/Chest: Effort normal and breath sounds normal. No respiratory distress. He has no rales.  Abdominal: Soft. Bowel sounds are normal. He exhibits no distension. There is no tenderness.  Musculoskeletal: Normal  range of motion. He exhibits no edema.  Neurological: He is alert and oriented to person, place, and time. He displays normal reflexes.  Skin: Skin is warm and dry. No erythema.  Psychiatric: Affect normal.     LABORATORY DATA:  I have reviewed the data as listed Lab Results  Component Value Date   WBC 17.5 (H) 10/24/2017   HGB 12.8 (L) 10/24/2017   HCT 38.8 (L) 10/24/2017   MCV 90.0 10/24/2017   PLT 246 10/24/2017   Recent Labs    10/15/17 1628 10/24/17 0919  NA 139 139  K 4.5 4.2  CL 103 103  CO2 25 28  GLUCOSE 151* 96  BUN 26* 21*  CREATININE 2.58* 2.07*  CALCIUM 8.5* 7.5*  GFRNONAA 23* 30*  GFRAA 27* 35*  PROT 7.7 6.5  ALBUMIN 3.2* 2.8*  AST 111* 76*  ALT 67* 49  ALKPHOS 149* 138*  BILITOT 0.9 1.0   Negative hepatitis  panel LDH 157 Negative flowcytometry Negative BCR ABL UA showed proteinuria     ASSESSMENT & PLAN:  1. Leukocytosis, unspecified type   2. Nausea and vomiting, intractability of vomiting not specified, unspecified vomiting type   3. Weight loss   4. CKD (chronic kidney disease) stage 4, GFR 15-29 ml/min (HCC)   5. Anemia, unspecified type   6. Transaminitis   7. Abnormal weight loss   8. Supraclavicular lymphadenopathy    # Leukocytosis with predominant neutrophilia and monocytosis, reactive vs myeloproliferative disease. repeat CBC showed leukocytosis has trended down.  Work up negative so far including negative for cytometry, normal LDH.  negative BCR ABL fish.  His last CBC showed hemoglobin trended down to 12.8, we'll check SPEP, light chain ratio, occult stool card 3, iron panel, folate and B12. Check TSH  # Nausea vomiting and weight loss; etiology unknown Patient had a CT abdomen pelvis on 10/15/2017 when he presented to the ER for evaluation. CT scan was negative for mass. Positive for extensive atherosclerosis. He has chronic CKD stage IV. Differential diagnoses include gastroparesis, malignancy, uremia induced nausea vomiting.  I discussed with patient about importance following up with GI  for additional evaluation.  #To complete workup, I will order CT chest and neck. Non contrast study due to CKD. .  All questions were answered. The patient knows to call the clinic with any problems questions or concerns.  Return of visit:  2 weeks to discuss lab and image results.   Addendum:  Labs showed elevated TSH, he is on synthroid 33mcg. Recommend patient to follow up with primary for titration of synthroid.  Mildly decreased folate, will start him on folic acid.   Earlie Server, MD, PhD Hematology Oncology [facility] [place] [facility] Pager- [phone] 11/07/2017

## 2017-11-07 ENCOUNTER — Encounter: Payer: Self-pay | Admitting: Oncology

## 2017-11-07 ENCOUNTER — Ambulatory Visit: Payer: No Typology Code available for payment source

## 2017-11-07 ENCOUNTER — Inpatient Hospital Stay: Payer: Medicare Other | Attending: Oncology | Admitting: Oncology

## 2017-11-07 ENCOUNTER — Other Ambulatory Visit: Payer: Self-pay | Admitting: Oncology

## 2017-11-07 VITALS — BP 140/64 | HR 44 | Temp 97.2°F | Resp 16 | Wt 153.0 lb

## 2017-11-07 DIAGNOSIS — I129 Hypertensive chronic kidney disease with stage 1 through stage 4 chronic kidney disease, or unspecified chronic kidney disease: Secondary | ICD-10-CM

## 2017-11-07 DIAGNOSIS — E079 Disorder of thyroid, unspecified: Secondary | ICD-10-CM | POA: Diagnosis not present

## 2017-11-07 DIAGNOSIS — R591 Generalized enlarged lymph nodes: Secondary | ICD-10-CM | POA: Diagnosis not present

## 2017-11-07 DIAGNOSIS — N184 Chronic kidney disease, stage 4 (severe): Secondary | ICD-10-CM | POA: Diagnosis not present

## 2017-11-07 DIAGNOSIS — R1310 Dysphagia, unspecified: Secondary | ICD-10-CM | POA: Diagnosis not present

## 2017-11-07 DIAGNOSIS — I2510 Atherosclerotic heart disease of native coronary artery without angina pectoris: Secondary | ICD-10-CM

## 2017-11-07 DIAGNOSIS — R634 Abnormal weight loss: Secondary | ICD-10-CM | POA: Diagnosis not present

## 2017-11-07 DIAGNOSIS — E039 Hypothyroidism, unspecified: Secondary | ICD-10-CM | POA: Insufficient documentation

## 2017-11-07 DIAGNOSIS — Z87891 Personal history of nicotine dependence: Secondary | ICD-10-CM

## 2017-11-07 DIAGNOSIS — R590 Localized enlarged lymph nodes: Secondary | ICD-10-CM

## 2017-11-07 DIAGNOSIS — R630 Anorexia: Secondary | ICD-10-CM

## 2017-11-07 DIAGNOSIS — D649 Anemia, unspecified: Secondary | ICD-10-CM | POA: Diagnosis not present

## 2017-11-07 DIAGNOSIS — R112 Nausea with vomiting, unspecified: Secondary | ICD-10-CM | POA: Diagnosis not present

## 2017-11-07 DIAGNOSIS — R740 Nonspecific elevation of levels of transaminase and lactic acid dehydrogenase [LDH]: Secondary | ICD-10-CM

## 2017-11-07 DIAGNOSIS — D72829 Elevated white blood cell count, unspecified: Secondary | ICD-10-CM | POA: Diagnosis not present

## 2017-11-07 DIAGNOSIS — R946 Abnormal results of thyroid function studies: Secondary | ICD-10-CM

## 2017-11-07 DIAGNOSIS — R7401 Elevation of levels of liver transaminase levels: Secondary | ICD-10-CM

## 2017-11-07 DIAGNOSIS — I4891 Unspecified atrial fibrillation: Secondary | ICD-10-CM

## 2017-11-07 DIAGNOSIS — R531 Weakness: Secondary | ICD-10-CM

## 2017-11-07 DIAGNOSIS — Z79899 Other long term (current) drug therapy: Secondary | ICD-10-CM | POA: Diagnosis not present

## 2017-11-07 LAB — IRON AND TIBC
Iron: 35 ug/dL — ABNORMAL LOW (ref 45–182)
Saturation Ratios: 18 % (ref 17.9–39.5)
TIBC: 195 ug/dL — ABNORMAL LOW (ref 250–450)
UIBC: 160 ug/dL

## 2017-11-07 LAB — CBC WITH DIFFERENTIAL/PLATELET
BASOS ABS: 0.1 10*3/uL (ref 0–0.1)
Basophils Relative: 1 %
EOS PCT: 3 %
Eosinophils Absolute: 0.5 10*3/uL (ref 0–0.7)
HCT: 40.5 % (ref 40.0–52.0)
Hemoglobin: 13.4 g/dL (ref 13.0–18.0)
LYMPHS ABS: 1.9 10*3/uL (ref 1.0–3.6)
LYMPHS PCT: 11 %
MCH: 29.5 pg (ref 26.0–34.0)
MCHC: 33 g/dL (ref 32.0–36.0)
MCV: 89.5 fL (ref 80.0–100.0)
MONO ABS: 1.4 10*3/uL — AB (ref 0.2–1.0)
Monocytes Relative: 8 %
Neutro Abs: 13.9 10*3/uL — ABNORMAL HIGH (ref 1.4–6.5)
Neutrophils Relative %: 77 %
PLATELETS: 246 10*3/uL (ref 150–440)
RBC: 4.53 MIL/uL (ref 4.40–5.90)
RDW: 15 % — AB (ref 11.5–14.5)
WBC: 17.8 10*3/uL — ABNORMAL HIGH (ref 3.8–10.6)

## 2017-11-07 LAB — VITAMIN B12: Vitamin B-12: 713 pg/mL (ref 180–914)

## 2017-11-07 LAB — TSH: TSH: 11.158 u[IU]/mL — ABNORMAL HIGH (ref 0.350–4.500)

## 2017-11-07 LAB — FOLATE: Folate: 5.8 ng/mL — ABNORMAL LOW (ref >5.9)

## 2017-11-07 LAB — FERRITIN: FERRITIN: 254 ng/mL (ref 24–336)

## 2017-11-07 MED ORDER — FOLIC ACID 1 MG PO TABS: 1.0000 mg | ORAL_TABLET | Freq: Every day | ORAL | 1 refills | Status: DC

## 2017-11-08 LAB — IMMUNOFIXATION ELECTROPHORESIS
IGG (IMMUNOGLOBIN G), SERUM: 751 mg/dL (ref 700–1600)
IgA: 786 mg/dL — ABNORMAL HIGH (ref 61–437)
IgM (Immunoglobulin M), Srm: 72 mg/dL (ref 15–143)
TOTAL PROTEIN ELP: 6.2 g/dL (ref 6.0–8.5)

## 2017-11-08 LAB — PROTEIN ELECTROPHORESIS, SERUM
A/G Ratio: 0.8 (ref 0.7–1.7)
ALBUMIN ELP: 2.8 g/dL — AB (ref 2.9–4.4)
ALPHA-1-GLOBULIN: 0.3 g/dL (ref 0.0–0.4)
Alpha-2-Globulin: 0.9 g/dL (ref 0.4–1.0)
Beta Globulin: 1 g/dL (ref 0.7–1.3)
GAMMA GLOBULIN: 1.1 g/dL (ref 0.4–1.8)
GLOBULIN, TOTAL: 3.3 g/dL (ref 2.2–3.9)
M-Spike, %: 0.3 g/dL — ABNORMAL HIGH
TOTAL PROTEIN ELP: 6.1 g/dL (ref 6.0–8.5)

## 2017-11-08 LAB — KAPPA/LAMBDA LIGHT CHAINS
KAPPA FREE LGHT CHN: 43.8 mg/L — AB (ref 3.3–19.4)
Kappa, lambda light chain ratio: 1 (ref 0.26–1.65)
Lambda free light chains: 43.7 mg/L — ABNORMAL HIGH (ref 5.7–26.3)

## 2017-11-08 NOTE — Progress Notes (Signed)
Left vm to return my call

## 2017-11-09 DIAGNOSIS — I129 Hypertensive chronic kidney disease with stage 1 through stage 4 chronic kidney disease, or unspecified chronic kidney disease: Secondary | ICD-10-CM | POA: Diagnosis not present

## 2017-11-09 DIAGNOSIS — R634 Abnormal weight loss: Secondary | ICD-10-CM | POA: Diagnosis not present

## 2017-11-09 DIAGNOSIS — D72829 Elevated white blood cell count, unspecified: Secondary | ICD-10-CM | POA: Diagnosis not present

## 2017-11-09 DIAGNOSIS — R112 Nausea with vomiting, unspecified: Secondary | ICD-10-CM | POA: Diagnosis not present

## 2017-11-09 DIAGNOSIS — E039 Hypothyroidism, unspecified: Secondary | ICD-10-CM | POA: Diagnosis not present

## 2017-11-09 DIAGNOSIS — N184 Chronic kidney disease, stage 4 (severe): Secondary | ICD-10-CM | POA: Diagnosis not present

## 2017-11-10 DIAGNOSIS — D72829 Elevated white blood cell count, unspecified: Secondary | ICD-10-CM | POA: Diagnosis not present

## 2017-11-10 DIAGNOSIS — E039 Hypothyroidism, unspecified: Secondary | ICD-10-CM | POA: Diagnosis not present

## 2017-11-10 DIAGNOSIS — R634 Abnormal weight loss: Secondary | ICD-10-CM | POA: Diagnosis not present

## 2017-11-10 DIAGNOSIS — I129 Hypertensive chronic kidney disease with stage 1 through stage 4 chronic kidney disease, or unspecified chronic kidney disease: Secondary | ICD-10-CM | POA: Diagnosis not present

## 2017-11-10 DIAGNOSIS — N184 Chronic kidney disease, stage 4 (severe): Secondary | ICD-10-CM | POA: Diagnosis not present

## 2017-11-10 DIAGNOSIS — R112 Nausea with vomiting, unspecified: Secondary | ICD-10-CM | POA: Diagnosis not present

## 2017-11-14 ENCOUNTER — Ambulatory Visit (INDEPENDENT_AMBULATORY_CARE_PROVIDER_SITE_OTHER): Payer: Medicare Other | Admitting: Gastroenterology

## 2017-11-14 ENCOUNTER — Encounter: Payer: Self-pay | Admitting: Gastroenterology

## 2017-11-14 ENCOUNTER — Encounter (INDEPENDENT_AMBULATORY_CARE_PROVIDER_SITE_OTHER): Payer: Self-pay

## 2017-11-14 VITALS — BP 170/83 | HR 47 | Temp 97.4°F | Ht 73.0 in | Wt 151.6 lb

## 2017-11-14 DIAGNOSIS — N184 Chronic kidney disease, stage 4 (severe): Secondary | ICD-10-CM | POA: Diagnosis not present

## 2017-11-14 DIAGNOSIS — R634 Abnormal weight loss: Secondary | ICD-10-CM

## 2017-11-14 DIAGNOSIS — E039 Hypothyroidism, unspecified: Secondary | ICD-10-CM | POA: Diagnosis not present

## 2017-11-14 DIAGNOSIS — R112 Nausea with vomiting, unspecified: Secondary | ICD-10-CM | POA: Diagnosis not present

## 2017-11-14 DIAGNOSIS — D72829 Elevated white blood cell count, unspecified: Secondary | ICD-10-CM | POA: Diagnosis not present

## 2017-11-14 DIAGNOSIS — I129 Hypertensive chronic kidney disease with stage 1 through stage 4 chronic kidney disease, or unspecified chronic kidney disease: Secondary | ICD-10-CM | POA: Diagnosis not present

## 2017-11-14 NOTE — Progress Notes (Signed)
Jonathon Bellows MD, MRCP(U.K) [address]  [place]  [place], [place] [address]  Main: [phone]  Fax: [phone]   Gastroenterology Consultation  Referring Provider:     Lavera Guise, MD Primary Care Physician:  Lavera Guise, MD Primary Gastroenterologist:  Dr. Jonathon Bellows  Reason for Consultation:     Nausea,vomiting and weight loss         HPI:   Alan Bennett is a 72 y.o. y/o male referred for consultation & management  by Dr. Humphrey Rolls, Timoteo Gaul, MD.     He has been referred for nausea, vomiting and weight loss. Ex smoker . On xarelto . TSH elevated  Low folate 5.8 . B12 normal , iron low normal. Hb 13.4 grams  Documented loss of weight 30 lbs over the last 6 months per Epic. Apetite was poor and says is now better. No issues swallowing , no abdominal pain. He says that not having regular bowel movements, no blood in the stool. Not had a drink in 20 years, Ex smoker quit 10 years back . No family history of colon cancer. His last colonoscopy was 8-10 years, recalls was normal.   He says that he gets nausea and vomiting about 3 times a week , began about a month back and last 3 weeks has stooped. Able to eat well at this time. Denies any headaches, no hearing issues, no vision issues.    BP (!) 170/83 (BP Location: Left Arm, Patient Position: Sitting, Cuff Size: Normal)   Pulse (!) 47   Temp (!) 97.4 F (36.3 C) (Oral)   Ht 6\' 1"  (1.854 m)   Wt 151 lb 9.6 oz (68.8 kg)   BMI 20.00 kg/m    Past Medical History:  Diagnosis Date  . Coronary artery disease   . Hypertension   . Renal disorder   . Thyroid disease     No past surgical history on file.  Prior to Admission medications   Medication Sig Start Date End Date Taking? Authorizing Provider  apixaban (ELIQUIS) 5 MG TABS tablet Take by mouth. 10/31/17  Yes [provider]  amiodarone (PACERONE) 200 MG tablet  03/21/17   [provider]  atorvastatin (LIPITOR) 40 MG tablet  04/28/17    [provider]  folic acid (FOLVITE) 1 MG tablet Take 1 tablet (1 mg total) by mouth daily. 11/07/17   Earlie Server, MD  isosorbide mononitrate (IMDUR) 30 MG 24 hr tablet  04/28/17   [provider]  levothyroxine (SYNTHROID, LEVOTHROID) 75 MCG tablet  04/08/17   [provider]  lisinopril (PRINIVIL,ZESTRIL) 5 MG tablet  04/28/17   [provider]  Omega-3 Fatty Acids (FISH OIL) 1000 MG CAPS Take 1,000 mg by mouth daily.    [provider]  ondansetron (ZOFRAN) 4 MG tablet Take 1 tablet (4 mg total) by mouth every 12 (twelve) hours as needed for nausea or vomiting. 10/24/17   Earlie Server, MD    Family History  Problem Relation Age of Onset  . Pneumonia Father   . Heart attack Brother      Social History   Tobacco Use  . Smoking status: Former Smoker    Years: 20.00  . Smokeless tobacco: Never Used  . Tobacco comment: 10 years ago  Substance Use Topics  . Alcohol use: No    Frequency: Never  . Drug use: No    Allergies as of 11/14/2017  . (No Known Allergies)    Review  of Systems:    All systems reviewed and negative except where noted in HPI.   Physical Exam:  There were no vitals taken for this visit. No LMP for male patient. Psych:  Alert and cooperative. Normal mood and affect. General:   Alert,  Well-developed, well-nourished, pleasant and cooperative in NAD Head:  Normocephalic and atraumatic. Eyes:  Sclera clear, no icterus.   Conjunctiva pink. Ears:  Normal auditory acuity. Nose:  No deformity, discharge, or lesions. Mouth:  No deformity or lesions,oropharynx pink & moist. Neck:  Supple; no masses or thyromegaly. Lungs:  Respirations even and unlabored.  Clear throughout to auscultation.   No wheezes, crackles, or rhonchi. No acute distress. Heart:  Regular rate and rhythm; no murmurs, clicks, rubs, or gallops. Abdomen:  Normal bowel sounds.  No bruits.  Soft, non-tender and non-distended without masses, hepatosplenomegaly or  hernias noted.  No guarding or rebound tenderness.    Msk:  Symmetrical without gross deformities. Good, equal movement & strength bilaterally. Pulses:  Normal pulses noted. Extremities:  No clubbing or edema.  No cyanosis. Neurologic:  Alert and oriented x3;  grossly normal neurologically. Skin:  Intact without significant lesions or rashes. No jaundice. Lymph Nodes:  No significant cervical adenopathy. Psych:  Alert and cooperative. Normal mood and affect.  Imaging Studies: Ct Abdomen Pelvis Wo Contrast  Result Date: 10/15/2017 CLINICAL DATA:  Initial evaluation for acute abdominal distension, nausea, vomiting. EXAM: CT ABDOMEN AND PELVIS WITHOUT CONTRAST TECHNIQUE: Multidetector CT imaging of the abdomen and pelvis was performed following the standard protocol without IV contrast. COMPARISON:  Prior CT from 08/06/2011. FINDINGS: Lower chest: Scattered emphysematous changes present within the visualized lung bases. Mild subsegmental atelectasis at the right lung base. Visualized lungs are otherwise clear. Cardiomegaly partially visualized. Hepatobiliary: 2.2 cm cyst noted within the left hepatic lobe. Liver otherwise unremarkable. Gallbladder within normal limits. No biliary dilatation. Pancreas: Pancreas within normal limits. Spleen: Spleen within normal limits. Adrenals/Urinary Tract: Adrenal glands are normal. Right kidney small and atrophic in appearance. Few scattered left renal cysts noted, largest of which measures 2 cm. No nephrolithiasis or hydronephrosis. No hydroureter. Bladder largely decompressed without acute abnormality. Stomach/Bowel: Small hiatal hernia noted. Stomach otherwise unremarkable. No evidence for bowel obstruction. No acute inflammatory changes seen about the bowels. Vascular/Lymphatic: Extensive atherosclerosis throughout the intra- abdominal aorta and its branch vessels. Infrarenal aortic aneurysm measures up to 3.9 cm in size. No adenopathy. Reproductive: Prostate within  normal limits. Other: No free air or fluid. Musculoskeletal: No acute osseus abnormality. No worrisome lytic or blastic osseous lesions. IMPRESSION: 1. No CT evidence for acute intra-abdominal or pelvic process. 2. Chronic right renal atrophy. 3. Extensive atherosclerosis with infrarenal aortic aneurysm measuring up to 3.9 cm in diameter. Recommend followup by ultrasound in 2 years. This recommendation follows ACR consensus guidelines: White Paper of the ACR Incidental Findings Committee II on Vascular Findings. J Am Coll Radiol 2013; 10:789-794. Electronically Signed   By: Jeannine Boga M.D.   On: 10/15/2017 22:42    Assessment and Plan:   Skyler Carel is a 72 y.o. y/o male has been referred for nausea,vomiting (presently resolved last 3 weeks),unintentional weight loss >30lbs last 6 months. No localizing issues per history   Plan  1. Address elevated TSH with PCP 2. EGD+colonoscopy  4. CT scan of the chest abdomen and pelvis to evaluate unintentional weight loss if endoscopy is negative . He is not keen on any imaging at this time 5. Ensure BID   Follow  up in 6 weeks   Dr Jonathon Bellows MD,MRCP(U.K)

## 2017-11-15 ENCOUNTER — Other Ambulatory Visit: Payer: Self-pay

## 2017-11-15 ENCOUNTER — Encounter: Payer: Self-pay | Admitting: Oncology

## 2017-11-15 DIAGNOSIS — D649 Anemia, unspecified: Secondary | ICD-10-CM

## 2017-11-15 LAB — OCCULT BLOOD X 1 CARD TO LAB, STOOL
FECAL OCCULT BLD: POSITIVE — AB
Fecal Occult Bld: POSITIVE — AB
Fecal Occult Bld: NEGATIVE

## 2017-11-18 ENCOUNTER — Other Ambulatory Visit: Payer: Self-pay

## 2017-11-18 ENCOUNTER — Encounter: Payer: Self-pay | Admitting: Gastroenterology

## 2017-11-18 ENCOUNTER — Telehealth: Payer: Self-pay | Admitting: Gastroenterology

## 2017-11-18 MED ORDER — PEG 3350-KCL-NA BICARB-NACL 420 G PO SOLR: 4000.0000 mL | Freq: Once | ORAL | 0 refills | Status: AC

## 2017-11-18 NOTE — Telephone Encounter (Signed)
Error

## 2017-11-18 NOTE — Telephone Encounter (Signed)
Please send in generic bowel prep to [facility] on [address].

## 2017-11-19 NOTE — Telephone Encounter (Signed)
Advised patient of medical clearance received from Dr. Ubaldo Glassing concerning Alan Bennett.   Patient to stop taking on 2/19 and restart on 2/22.   Verbally explained prep instructions. Patient stated the instructions were hard for him to read.

## 2017-11-22 NOTE — Telephone Encounter (Signed)
Correction to dates:  Stop Eliquis on 1/19 and restart on 1/22.  Procedure at [facility]

## 2017-11-25 ENCOUNTER — Ambulatory Visit: Payer: No Typology Code available for payment source | Admitting: Gastroenterology

## 2017-11-25 ENCOUNTER — Ambulatory Visit: Payer: Medicare Other | Admitting: Anesthesiology

## 2017-11-25 ENCOUNTER — Encounter: Payer: Self-pay | Admitting: *Deleted

## 2017-11-25 ENCOUNTER — Ambulatory Visit
Admission: RE | Admit: 2017-11-25 | Discharge: 2017-11-25 | Disposition: A | Discharge status: Home / Self Care | Payer: Medicare Other | Source: Ambulatory Visit | Attending: Gastroenterology | Admitting: Gastroenterology

## 2017-11-25 ENCOUNTER — Encounter
Admission: RE | Disposition: A | Discharge status: Home / Self Care | Payer: Self-pay | Source: Ambulatory Visit | Attending: Gastroenterology

## 2017-11-25 DIAGNOSIS — I2510 Atherosclerotic heart disease of native coronary artery without angina pectoris: Secondary | ICD-10-CM | POA: Diagnosis not present

## 2017-11-25 DIAGNOSIS — D128 Benign neoplasm of rectum: Secondary | ICD-10-CM | POA: Insufficient documentation

## 2017-11-25 DIAGNOSIS — M1711 Unilateral primary osteoarthritis, right knee: Secondary | ICD-10-CM | POA: Diagnosis not present

## 2017-11-25 DIAGNOSIS — Z87891 Personal history of nicotine dependence: Secondary | ICD-10-CM | POA: Diagnosis not present

## 2017-11-25 DIAGNOSIS — Z79899 Other long term (current) drug therapy: Secondary | ICD-10-CM | POA: Diagnosis not present

## 2017-11-25 DIAGNOSIS — I10 Essential (primary) hypertension: Secondary | ICD-10-CM | POA: Diagnosis not present

## 2017-11-25 DIAGNOSIS — K222 Esophageal obstruction: Secondary | ICD-10-CM | POA: Insufficient documentation

## 2017-11-25 DIAGNOSIS — D120 Benign neoplasm of cecum: Secondary | ICD-10-CM | POA: Insufficient documentation

## 2017-11-25 DIAGNOSIS — R634 Abnormal weight loss: Secondary | ICD-10-CM | POA: Diagnosis not present

## 2017-11-25 DIAGNOSIS — D132 Benign neoplasm of duodenum: Secondary | ICD-10-CM | POA: Insufficient documentation

## 2017-11-25 DIAGNOSIS — E079 Disorder of thyroid, unspecified: Secondary | ICD-10-CM | POA: Insufficient documentation

## 2017-11-25 DIAGNOSIS — K2970 Gastritis, unspecified, without bleeding: Secondary | ICD-10-CM | POA: Diagnosis not present

## 2017-11-25 DIAGNOSIS — K635 Polyp of colon: Secondary | ICD-10-CM | POA: Diagnosis not present

## 2017-11-25 DIAGNOSIS — D123 Benign neoplasm of transverse colon: Secondary | ICD-10-CM

## 2017-11-25 DIAGNOSIS — D124 Benign neoplasm of descending colon: Secondary | ICD-10-CM | POA: Insufficient documentation

## 2017-11-25 DIAGNOSIS — K648 Other hemorrhoids: Secondary | ICD-10-CM | POA: Diagnosis not present

## 2017-11-25 DIAGNOSIS — K640 First degree hemorrhoids: Secondary | ICD-10-CM | POA: Diagnosis not present

## 2017-11-25 DIAGNOSIS — K621 Rectal polyp: Secondary | ICD-10-CM

## 2017-11-25 DIAGNOSIS — K317 Polyp of stomach and duodenum: Secondary | ICD-10-CM | POA: Diagnosis not present

## 2017-11-25 DIAGNOSIS — K2950 Unspecified chronic gastritis without bleeding: Secondary | ICD-10-CM | POA: Diagnosis not present

## 2017-11-25 HISTORY — PX: COLONOSCOPY WITH PROPOFOL: SHX5780

## 2017-11-25 HISTORY — PX: ESOPHAGOGASTRODUODENOSCOPY (EGD) WITH PROPOFOL: SHX5813

## 2017-11-25 SURGERY — ESOPHAGOGASTRODUODENOSCOPY (EGD) WITH PROPOFOL
Anesthesia: General

## 2017-11-25 MED ORDER — PROPOFOL 10 MG/ML IV BOLUS
INTRAVENOUS | Status: AC
  Filled 2017-11-25: qty 20

## 2017-11-25 MED ORDER — GLYCOPYRROLATE 0.2 MG/ML IJ SOLN
INTRAMUSCULAR | Status: AC
  Filled 2017-11-25: qty 1

## 2017-11-25 MED ORDER — PROPOFOL 500 MG/50ML IV EMUL
INTRAVENOUS | Status: DC | PRN
  Administered 2017-11-25: 120 ug/kg/min via INTRAVENOUS

## 2017-11-25 MED ORDER — SODIUM CHLORIDE 0.9 % IV SOLN
INTRAVENOUS | Status: DC
  Administered 2017-11-25: 07:00:00 via INTRAVENOUS

## 2017-11-25 MED ORDER — PROPOFOL 10 MG/ML IV BOLUS
INTRAVENOUS | Status: DC | PRN
  Administered 2017-11-25: 70 mg via INTRAVENOUS

## 2017-11-25 MED ORDER — PROPOFOL 500 MG/50ML IV EMUL
INTRAVENOUS | Status: AC
  Filled 2017-11-25: qty 100

## 2017-11-25 MED ORDER — PROPOFOL 500 MG/50ML IV EMUL
INTRAVENOUS | Status: AC
  Filled 2017-11-25: qty 50

## 2017-11-25 MED ORDER — LIDOCAINE HCL (PF) 2 % IJ SOLN
INTRAMUSCULAR | Status: AC
  Filled 2017-11-25: qty 10

## 2017-11-25 MED ORDER — OMEPRAZOLE 40 MG PO CPDR: 40.0000 mg | DELAYED_RELEASE_CAPSULE | Freq: Every day | ORAL | 1 refills | Status: DC

## 2017-11-25 NOTE — Op Note (Signed)
[facility] Patient Name: Alan Bennett Procedure Date: 11/25/2017 7:44 AM MRN: [id number] Account #: [id number] Date of Birth: 1946/01/28 Admit Type: Outpatient Age: 72 Room: [facility] ENDO ROOM 4 Gender: Male Note Status: Finalized Procedure:            Colonoscopy Indications:          Weight loss Providers:            Jonathon Bellows MD, MD Referring MD:         Lavera Guise, MD (Referring MD) Medicines:            Monitored Anesthesia Care Complications:        No immediate complications. Procedure:            Pre-Anesthesia Assessment:                       - Prior to the procedure, a History and Physical was                        performed, and patient medications, allergies and                        sensitivities were reviewed. The patient's tolerance of                        previous anesthesia was reviewed.                       - The risks and benefits of the procedure and the                        sedation options and risks were discussed with the                        patient. All questions were answered and informed                        consent was obtained.                       - ASA Grade Assessment: III - A patient with severe                        systemic disease.                       After obtaining informed consent, the colonoscope was                        passed under direct vision. Throughout the procedure,                        the patient's blood pressure, pulse, and oxygen                        saturations were monitored continuously. The                        Colonoscope was introduced through the anus and                        advanced  to the the cecum, identified by the                        appendiceal orifice, IC valve and transillumination.                        The colonoscopy was performed with ease. The patient                        tolerated the procedure well. The quality of the bowel         preparation was good. Findings:      The perianal and digital rectal examinations were normal.      Non-bleeding internal hemorrhoids were found during retroflexion. The       hemorrhoids were medium-sized and Grade I (internal hemorrhoids that do       not prolapse).      Three sessile polyps were found in the cecum. The polyps were 3 to 4 mm       in size. These polyps were removed with a cold snare. Resection and       retrieval were complete.      Three sessile polyps were found in the rectum. The polyps were 3 to 5 mm       in size. These polyps were removed with a cold snare. Resection and       retrieval were complete.      Five sessile polyps were found in the transverse colon. The polyps were       8 to 9 mm in size. These polyps were removed with a hot snare. Resection       and retrieval were complete.      A 3 mm polyp was found in the descending colon. The polyp was sessile.       The polyp was removed with a cold biopsy forceps. Resection and       retrieval were complete.      A 13 mm polyp was found in the transverse colon. The polyp was       semi-sessile. The polyp was removed with a hot snare. The polyp was       removed with a piecemeal technique using a hot snare. Resection and       retrieval were complete. To prevent bleeding after the polypectomy, two       hemostatic clips were successfully placed. There was no bleeding during,       or at the end, of the procedure.      The exam was otherwise without abnormality on direct and retroflexion       views. Impression:           - Non-bleeding internal hemorrhoids.                       - Three 3 to 4 mm polyps in the cecum, removed with a                        cold snare. Resected and retrieved.                       - Three 3 to 5 mm polyps in the rectum, removed with a  cold snare. Resected and retrieved.                       - Five 8 to 9 mm polyps in the transverse colon,                         removed with a hot snare. Resected and retrieved.                       - One 3 mm polyp in the descending colon, removed with                        a cold biopsy forceps. Resected and retrieved.                       - One 13 mm polyp in the transverse colon, removed with                        a hot snare and removed piecemeal using a hot snare.                        Resected and retrieved. Clips were placed.                       - The examination was otherwise normal on direct and                        retroflexion views. Recommendation:       - Discharge patient to home (with escort).                       - Resume previous diet.                       - Continue present medications.                       - Repeat colonoscopy in 6 months for surveillance after                        piecemeal polypectomy.                       - 1. Resume anticoagulation tomorrow                       2. Follow up in my office in 6 weeks                       3. If continues to lose weightr suggest obtaining CT                        scan of the abdomen/pelvis and chest Procedure Code(s):    --- Professional ---                       [phone], Colonoscopy, flexible; with removal of tumor(s),                        polyp(s), or other lesion(s) by snare technique  [id number], 69, Colonoscopy, flexible; with biopsy, single                        or multiple Diagnosis Code(s):    --- Professional ---                       D12.0, Benign neoplasm of cecum                       K62.1, Rectal polyp                       D12.3, Benign neoplasm of transverse colon (hepatic                        flexure or splenic flexure)                       D12.4, Benign neoplasm of descending colon                       K64.0, First degree hemorrhoids                       R63.4, Abnormal weight loss CPT copyright 2016 American Medical Association. All rights reserved. The codes documented  in this report are preliminary and upon coder review may  be revised to meet current compliance requirements. Jonathon Bellows, MD Jonathon Bellows MD, MD 11/25/2017 8:16:59 AM This report has been signed electronically. Number of Addenda: 0 Note Initiated On: 11/25/2017 7:44 AM Scope Withdrawal Time: 0 hours 23 minutes 2 seconds  Total Procedure Duration: 0 hours 25 minutes 58 seconds       [facility]

## 2017-11-25 NOTE — Op Note (Signed)
[facility] Patient Name: Alan Bennett Procedure Date: 11/25/2017 7:29 AM MRN: [id number] Account #: [id number] Date of Birth: [birth year] Admit Type: Ambulatory Age: 72 Room: [facility] ENDO ROOM 4 Gender: Male Note Status: Finalized Procedure:            Upper GI endoscopy Indications:          Weight loss Providers:            Jonathon Bellows MD, MD Medicines:            Monitored Anesthesia Care Complications:        No immediate complications. Procedure:            Pre-Anesthesia Assessment:                       - ASA Grade Assessment: III - A patient with severe                        systemic disease.                       After obtaining informed consent, the endoscope was                        passed under direct vision. Throughout the procedure,                        the patient's blood pressure, pulse, and oxygen                        saturations were monitored continuously. The Endoscope                        was introduced through the mouth, and advanced to the                        third part of duodenum. The upper GI endoscopy was                        accomplished with ease. The patient tolerated the                        procedure well. Findings:      A mild Schatzki ring (acquired) was found at the gastroesophageal       junction.      Diffuse moderate inflammation characterized by congestion (edema) and       erythema was found in the gastric antrum and in the prepyloric region of       the stomach. Biopsies were taken with a cold forceps for histology.      A single 8 mm sessile polyp with no bleeding was found in the third       portion of the duodenum. Biopsies were taken with a cold forceps for       histology. Single bite taken from edge- polyp not resected completely      The exam was otherwise without abnormality. Impression:           - Mild Schatzki ring.                       - Gastritis. Biopsied.  - A single duodenal polyp. Biopsied.                       -  The examination was otherwise normal. Recommendation:       - Await pathology results.                       - Perform a colonoscopy today.                       - 1. Single bite taken from edge of duodenal polyp. The                        polyp not resected completely, if pathology shows an                        adenoma then will need resected. Procedure Code(s):    --- Professional ---                       [phone], Esophagogastroduodenoscopy, flexible, transoral;                        with biopsy, single or multiple Diagnosis Code(s):    --- Professional ---                       K22.2, Esophageal obstruction                       K29.70, Gastritis, unspecified, without bleeding                       K31.7, Polyp of stomach and duodenum                       R63.4, Abnormal weight loss CPT copyright 2016 American Medical Association. All rights reserved. The codes documented in this report are preliminary and upon coder review may  be revised to meet current compliance requirements. Jonathon Bellows, MD Jonathon Bellows MD, MD 11/25/2017 7:42:39 AM This report has been signed electronically. Number of Addenda: 0 Note Initiated On: 11/25/2017 7:29 AM      [facility]

## 2017-11-25 NOTE — Transfer of Care (Signed)
Immediate Anesthesia Transfer of Care Note  Patient: Alan Bennett  Procedure(s) Performed: ESOPHAGOGASTRODUODENOSCOPY (EGD) WITH PROPOFOL (N/A ) COLONOSCOPY WITH PROPOFOL (N/A )  Patient Location: PACU  Anesthesia Type:General  Level of Consciousness: awake, alert  and oriented  Airway & Oxygen Therapy: Patient Spontanous Breathing and Patient connected to nasal cannula oxygen  Post-op Assessment: Report given to RN and Post -op Vital signs reviewed and stable  Post vital signs: Reviewed and stable  Last Vitals:  Vitals:   11/25/17 0708 11/25/17 0818  BP: 107/63 99/73  Pulse: (!) 54 92  Resp: 16 (!) 21  Temp: (!) 36.3 C (!) 36.1 C  SpO2: 99% 96%    Last Pain:  Vitals:   11/25/17 0708  TempSrc: Tympanic         Complications: No apparent anesthesia complications

## 2017-11-25 NOTE — Anesthesia Post-op Follow-up Note (Signed)
Anesthesia QCDR form completed.        

## 2017-11-25 NOTE — Anesthesia Preprocedure Evaluation (Signed)
Anesthesia Evaluation  Patient identified by MRN, date of birth, ID band Patient awake    Reviewed: Allergy & Precautions, NPO status , Patient's Chart, lab work & pertinent test results  History of Anesthesia Complications Negative for: history of anesthetic complications  Airway Mallampati: III  TM Distance: >3 FB Neck ROM: Full    Dental  (+) Upper Dentures, Lower Dentures   Pulmonary neg sleep apnea, neg COPD, former smoker,    breath sounds clear to auscultation- rhonchi (-) wheezing      Cardiovascular hypertension, Pt. on medications + CAD (nonocclusive)  (-) Past MI, (-) Cardiac Stents and (-) CABG  Rhythm:Regular Rate:Normal - Systolic murmurs and - Diastolic murmurs    Neuro/Psych negative neurological ROS  negative psych ROS   GI/Hepatic negative GI ROS, Neg liver ROS,   Endo/Other  negative endocrine ROSneg diabetes  Renal/GU Renal InsufficiencyRenal disease     Musculoskeletal  (+) Arthritis ,   Abdominal (+) - obese,   Peds  Hematology negative hematology ROS (+)   Anesthesia Other Findings Past Medical History: No date: Coronary artery disease No date: Hypertension No date: Renal disorder No date: Thyroid disease   Reproductive/Obstetrics                             Anesthesia Physical Anesthesia Plan  ASA: III  Anesthesia Plan: General   Post-op Pain Management:    Induction: Intravenous  PONV Risk Score and Plan: 1 and Propofol infusion  Airway Management Planned: Natural Airway  Additional Equipment:   Intra-op Plan:   Post-operative Plan:   Informed Consent: I have reviewed the patients History and Physical, chart, labs and discussed the procedure including the risks, benefits and alternatives for the proposed anesthesia with the patient or authorized representative who has indicated his/her understanding and acceptance.   Dental advisory  given  Plan Discussed with: CRNA and Anesthesiologist  Anesthesia Plan Comments:         Anesthesia Quick Evaluation

## 2017-11-25 NOTE — Anesthesia Postprocedure Evaluation (Signed)
Anesthesia Post Note  Patient: Alan Bennett  Procedure(s) Performed: ESOPHAGOGASTRODUODENOSCOPY (EGD) WITH PROPOFOL (N/A ) COLONOSCOPY WITH PROPOFOL (N/A )  Patient location during evaluation: Endoscopy Anesthesia Type: General Level of consciousness: awake and alert and oriented Pain management: pain level controlled Vital Signs Assessment: post-procedure vital signs reviewed and stable Respiratory status: spontaneous breathing, nonlabored ventilation and respiratory function stable Cardiovascular status: blood pressure returned to baseline and stable Postop Assessment: no signs of nausea or vomiting Anesthetic complications: no     Last Vitals:  Vitals:   11/25/17 0827 11/25/17 0837  BP: (!) 125/92 139/87  Pulse: 94 82  Resp: 17 17  Temp:    SpO2: 97% 96%    Last Pain:  Vitals:   11/25/17 0837  TempSrc:   PainSc: 0-No pain                 Nathanial Arrighi

## 2017-11-25 NOTE — H&P (Signed)
Jonathon Bellows, MD [address] [place], [place], [place], [address] [id number], [place], [place], [place], [address] Phone: [phone]  Fax: [phone]  Primary Care Physician:  Lavera Guise, MD   Pre-Procedure History & Physical: HPI:  Alan Bennett is a 72 y.o. male is here for an endoscopy and colonoscopy    Past Medical History:  Diagnosis Date  . Coronary artery disease   . Hypertension   . Renal disorder   . Thyroid disease     History reviewed. No pertinent surgical history.  Prior to Admission medications   Medication Sig Start Date End Date Taking? Authorizing Provider  amiodarone (PACERONE) 200 MG tablet  03/21/17  Yes [provider]  atorvastatin (LIPITOR) 40 MG tablet  04/28/17  Yes [provider]  folic acid (FOLVITE) 1 MG tablet Take 1 tablet (1 mg total) by mouth daily. 11/07/17  Yes Earlie Server, MD  isosorbide mononitrate (IMDUR) 30 MG 24 hr tablet  04/28/17  Yes [provider]  levothyroxine (SYNTHROID, LEVOTHROID) 75 MCG tablet  04/08/17  Yes [provider]  lisinopril (PRINIVIL,ZESTRIL) 5 MG tablet  04/28/17  Yes [provider]  Omega-3 Fatty Acids (FISH OIL) 1000 MG CAPS Take 1,000 mg by mouth daily.   Yes [provider]  ondansetron (ZOFRAN) 4 MG tablet Take 1 tablet (4 mg total) by mouth every 12 (twelve) hours as needed for nausea or vomiting. 10/24/17  Yes Earlie Server, MD  apixaban (ELIQUIS) 5 MG TABS tablet Take by mouth. 10/31/17   [provider]    Allergies as of 11/14/2017  . (No Known Allergies)    Family History  Problem Relation Age of Onset  . Pneumonia Father   . Heart attack Brother     Social History   Socioeconomic History  . Marital status: Married    Spouse name: Not on file  . Number of children: Not on file  . Years of education: Not on file  . Highest education level: Not on file  Social Needs  . Financial resource strain: Not on file  . Food  insecurity - worry: Not on file  . Food insecurity - inability: Not on file  . Transportation needs - medical: Not on file  . Transportation needs - non-medical: Not on file  Occupational History  . Not on file  Tobacco Use  . Smoking status: Former Smoker    Years: 20.00  . Smokeless tobacco: Never Used  . Tobacco comment: 10 years ago  Substance and Sexual Activity  . Alcohol use: No    Frequency: Never  . Drug use: No  . Sexual activity: Yes  Other Topics Concern  . Not on file  Social History Narrative  . Not on file    Review of Systems: See HPI, otherwise negative ROS  Physical Exam: BP 107/63   Pulse (!) 54   Temp (!) 97.3 F (36.3 C) (Tympanic)   Resp 16   SpO2 99%  General:   Alert,  pleasant and cooperative in NAD Head:  Normocephalic and atraumatic. Neck:  Supple; no masses or thyromegaly. Lungs:  Clear throughout to auscultation, normal respiratory effort.    Heart:  +S1, +S2, Regular rate and rhythm, No edema. Abdomen:  Soft, nontender and nondistended. Normal bowel sounds, without guarding, and without rebound.   Neurologic:  Alert and  oriented x4;  grossly normal neurologically.  Impression/Plan: Alan Bennett is here for an endoscopy and colonoscopy  to be performed for  evaluation of unintentional weight loss    Risks, benefits, limitations, and alternatives regarding endoscopy have been reviewed with the patient.  Questions have been answered.  All parties agreeable.   Jonathon Bellows, MD  11/25/2017, 7:20 AM

## 2017-11-26 ENCOUNTER — Encounter: Payer: Self-pay | Admitting: Gastroenterology

## 2017-11-27 LAB — SURGICAL PATHOLOGY

## 2017-11-27 NOTE — Progress Notes (Signed)
Hematology/Oncology Follow up note [facility] [phone] [phone]   Patient Care Team: Lavera Guise, MD as PCP - General (Internal Medicine)  REFERRING PROVIDER: Merlyn Lot CHIEF COMPLAINTS/PURPOSE OF CONSULTATION:  Leukocytosis  HISTORY OF PRESENTING ILLNESS:  Alan Bennett is a  72 y.o.  male with PMH listed below who was referred to me for evaluation of leukocytosis.  Patient presented to ER for evaluation of 3 months weight loss, nausea and vomiting. He had blood work done which showed leukocytosis, predominantly neutrophilia and monocytosis.  Patient was refer to me for further evaluation. Patient reports 4-6 weeks of feeling food stuck in the throat which triggers vomiting. He also has had a 15 pound weight loss over the 3 months. Denies any cough, shortness of breath. He is a former smoker, quit about 10 years ago. Denies any alcohol use. He has history of atrial fibrillation. Wife reports there is some confusion about his anticoagulation. He used to take aspirin and Plavix. Recently he was told to stop aspirin and Plavix and start Evalina Field which was never started to as patient has CKD. He has appointment to see cardiologist next week  He takes Zofran as needed which he got from the emergency room. He requests to have refilled as Zofran helps with the nausea symptom.  INTERVAL HISTORY Alan Bennett is a 72 y.o. male who has above history reviewed by me today presents for follow up visit for management of leukocytosis, weight loss, nausea.  During the interval he was seen and evaluated by Dr. Vicente Males and had EGD and colonoscopy done. Findings includes nonbleeding internal hemorrhoids, multiple polyps in the colon which were removed and resected.  EGD findings include mild Schatzki ring, gastritis and a single duodenal polyp which was biopsied. - Pathology revealed duodenal tubular adenoma, negative for high-grade dysplasia and  malignancy. Stomach biopsy negative for active inflammation dyspepsia and malignancy.  Negative for H. Pylori. Multiple tubular adenoma throughout cecum, transverse colon and descending colon.  Negative for high-grade dysplasia and malignancy. Rectum hyperplastic polyp negative for dysplasia and malignancy.  Small fragment of tubular adenoma.  Patient was started on folic acid supplementation for treatment of folate deficiency.  He reports that his appetite improved slightly.  He has gained half a pound since last visit.  He will continues to feel nauseated.  Takes Zofran as needed. He has atrial fibrillation and is supposed to be on Eliquis for anticoagulation.  He has not started on taking it because [facility] is [facility] to see if Eliquis can be covered.  He will have a follow-up appointment with his cardiologist and decide.   Review of Systems  Constitutional: Positive for malaise/fatigue and weight loss. Negative for fever.  HENT: Negative for hearing loss.   Eyes: Negative for blurred vision.  Respiratory: Negative for cough.   Cardiovascular: Negative for chest pain and orthopnea.  Gastrointestinal: Positive for nausea. Negative for abdominal pain, heartburn and vomiting.  Genitourinary: Negative for dysuria and frequency.  Musculoskeletal: Negative for myalgias.  Skin: Negative for rash.  Neurological: Negative for dizziness and weakness.  Endo/Heme/Allergies: Does not bruise/bleed easily.  Psychiatric/Behavioral: Negative for depression.    MEDICAL HISTORY:  Past Medical History:  Diagnosis Date  . Coronary artery disease   . Hypertension   . Renal disorder   . Thyroid disease     SURGICAL HISTORY: Past Surgical History:  Procedure Laterality Date  . COLONOSCOPY WITH PROPOFOL N/A 11/25/2017   Procedure: COLONOSCOPY WITH  PROPOFOL;  Surgeon: Jonathon Bellows, MD;  Location: [facility] ENDOSCOPY;  Service: Gastroenterology;  Laterality: N/A;  . ESOPHAGOGASTRODUODENOSCOPY (EGD)  WITH PROPOFOL N/A 11/25/2017   Procedure: ESOPHAGOGASTRODUODENOSCOPY (EGD) WITH PROPOFOL;  Surgeon: Jonathon Bellows, MD;  Location: [facility] ENDOSCOPY;  Service: Gastroenterology;  Laterality: N/A;    SOCIAL HISTORY: Social History   Socioeconomic History  . Marital status: Married    Spouse name: Not on file  . Number of children: Not on file  . Years of education: Not on file  . Highest education level: Not on file  Social Needs  . Financial resource strain: Not on file  . Food insecurity - worry: Not on file  . Food insecurity - inability: Not on file  . Transportation needs - medical: Not on file  . Transportation needs - non-medical: Not on file  Occupational History  . Not on file  Tobacco Use  . Smoking status: Former Smoker    Years: 20.00  . Smokeless tobacco: Never Used  . Tobacco comment: 10 years ago  Substance and Sexual Activity  . Alcohol use: No    Frequency: Never  . Drug use: No  . Sexual activity: Yes  Other Topics Concern  . Not on file  Social History Narrative  . Not on file    FAMILY HISTORY: Family History  Problem Relation Age of Onset  . Pneumonia Father   . Heart attack Brother     ALLERGIES:  has No Known Allergies.  MEDICATIONS:  Current Outpatient Medications  Medication Sig Dispense Refill  . amiodarone (PACERONE) 200 MG tablet     . atorvastatin (LIPITOR) 40 MG tablet     . folic acid (FOLVITE) 1 MG tablet Take 1 tablet (1 mg total) by mouth daily. 30 tablet 1  . isosorbide mononitrate (IMDUR) 30 MG 24 hr tablet     . levothyroxine (SYNTHROID, LEVOTHROID) 75 MCG tablet     . lisinopril (PRINIVIL,ZESTRIL) 5 MG tablet     . Omega-3 Fatty Acids (FISH OIL) 1000 MG CAPS Take 1,000 mg by mouth daily.    Marland Kitchen omeprazole (PRILOSEC) 40 MG capsule Take 1 capsule (40 mg total) by mouth daily. 30 capsule 1  . ondansetron (ZOFRAN) 4 MG tablet Take 1 tablet (4 mg total) by mouth every 12 (twelve) hours as needed for nausea or vomiting. 60 tablet 0  .  apixaban (ELIQUIS) 5 MG TABS tablet Take by mouth.     No current facility-administered medications for this visit.      PHYSICAL EXAMINATION: ECOG PERFORMANCE STATUS: 1 - Symptomatic but completely ambulatory Vitals:   11/28/17 0921  BP: 134/62  Pulse: (!) 51  Temp: (!) 97.5 F (36.4 C)   Filed Weights   11/28/17 0921  Weight: 153 lb 14.1 oz (69.8 kg)    Physical Exam  Constitutional: He is oriented to person, place, and time. No distress.  Thin.   HENT:  Head: Normocephalic and atraumatic.  Mouth/Throat: Oropharynx is clear and moist. No oropharyngeal exudate.  Eyes: EOM are normal. Pupils are equal, round, and reactive to light. Right eye exhibits no discharge. No scleral icterus.  Neck: Normal range of motion. Neck supple. No JVD present. No thyromegaly present.  Questionable left supraclavicular fullness.  Cardiovascular: Normal heart sounds.  No murmur heard. Irregular heart beats, bradycardia.   Pulmonary/Chest: Effort normal and breath sounds normal. No respiratory distress. He has no wheezes. He has no rales.  Abdominal: Soft. Bowel sounds are normal. He exhibits no distension. There is  no tenderness. There is no rebound.  Musculoskeletal: Normal range of motion. He exhibits no edema or tenderness.  Lymphadenopathy:    He has no cervical adenopathy.  Neurological: He is alert and oriented to person, place, and time. He displays normal reflexes. No cranial nerve deficit.  Skin: Skin is warm and dry. No erythema.  Psychiatric: Affect normal.     LABORATORY DATA:  I have reviewed the data as listed Lab Results  Component Value Date   WBC 14.6 (H) 11/28/2017   HGB 11.7 (L) 11/28/2017   HCT 34.8 (L) 11/28/2017   MCV 88.7 11/28/2017   PLT 188 11/28/2017   Recent Labs    10/15/17 1628 10/24/17 0919  NA 139 139  K 4.5 4.2  CL 103 103  CO2 25 28  GLUCOSE 151* 96  BUN 26* 21*  CREATININE 2.58* 2.07*  CALCIUM 8.5* 7.5*  GFRNONAA 23* 30*  GFRAA 27* 35*    PROT 7.7 6.5  ALBUMIN 3.2* 2.8*  AST 111* 76*  ALT 67* 49  ALKPHOS 149* 138*  BILITOT 0.9 1.0   Negative hepatitis panel LDH 157 Negative flowcytometry Negative BCR ABL UA showed proteinuria     ASSESSMENT & PLAN:  1. Neutrophilia   2. Weight loss   3. Hypothyroidism, unspecified type   4. Anemia, unspecified type   5. Folate deficiency   Leukocytosis predominantly neutrophilia and monocytosis, trending down gradually. For unknown reason patient symptom of lack of appetite and nausea improved as well. I wonder if he has had a virus infection recently.  I will go ahead and check EBV titers, but he may have passed acute setting already. #Negative BCR ABL fish, negative flow cytometry, normal LDH. Will check Jak 2 mutation to complete workup. #SPEP showed small monoclonal band, IgA is elevated.  Normal light chain ratio.  We will proceed with urine protein electrophoresis and immunofixation.  This may be an incidental finding. # Folate deficiency: continue folic acid  #Hypo-thyroidism:Labs showed elevated TSH, he is on synthroid 16mcg.  Hypothyroidism may contribute to his fatigue.  But cannot explain weight loss.  I advised patient to discuss with cardiologist to see if this safe to increase his Synthroid.  He can either get an out prescription from primary care physician or me.  Patient voices understanding he will update me. #  weight loss; etiology unknown Patient is a former smoker 20 pack a year not meeting lung cancer screening criteria.  His symptoms seems to be improving, although we have not pin down the reason of weight loss.  I will go ahead and obtain a chest x-ray.  If he continues to have weight loss with unexplained etiology, we will proceed with CT scan of the chest.  All questions were answered. The patient knows to call the clinic with any problems questions or concerns.  Return of visit:  2 weeks   Earlie Server, MD, PhD Hematology Oncology [facility] at [facility] Pager- [phone] 11/27/2017

## 2017-11-28 ENCOUNTER — Inpatient Hospital Stay (HOSPITAL_BASED_OUTPATIENT_CLINIC_OR_DEPARTMENT_OTHER): Payer: Medicare Other | Admitting: Oncology

## 2017-11-28 ENCOUNTER — Ambulatory Visit
Admission: RE | Admit: 2017-11-28 | Discharge: 2017-11-28 | Disposition: A | Discharge status: Home / Self Care | Payer: Medicare Other | Source: Ambulatory Visit | Attending: Oncology | Admitting: Oncology

## 2017-11-28 ENCOUNTER — Inpatient Hospital Stay: Payer: Medicare Other

## 2017-11-28 ENCOUNTER — Encounter: Payer: Self-pay | Admitting: Oncology

## 2017-11-28 ENCOUNTER — Other Ambulatory Visit: Payer: Self-pay

## 2017-11-28 VITALS — BP 134/62 | HR 51 | Temp 97.5°F | Wt 153.9 lb

## 2017-11-28 DIAGNOSIS — D729 Disorder of white blood cells, unspecified: Secondary | ICD-10-CM

## 2017-11-28 DIAGNOSIS — R634 Abnormal weight loss: Secondary | ICD-10-CM

## 2017-11-28 DIAGNOSIS — R112 Nausea with vomiting, unspecified: Secondary | ICD-10-CM | POA: Diagnosis not present

## 2017-11-28 DIAGNOSIS — J449 Chronic obstructive pulmonary disease, unspecified: Secondary | ICD-10-CM | POA: Insufficient documentation

## 2017-11-28 DIAGNOSIS — E538 Deficiency of other specified B group vitamins: Secondary | ICD-10-CM | POA: Diagnosis not present

## 2017-11-28 DIAGNOSIS — D649 Anemia, unspecified: Secondary | ICD-10-CM

## 2017-11-28 DIAGNOSIS — N184 Chronic kidney disease, stage 4 (severe): Secondary | ICD-10-CM | POA: Diagnosis not present

## 2017-11-28 DIAGNOSIS — D72829 Elevated white blood cell count, unspecified: Secondary | ICD-10-CM | POA: Diagnosis not present

## 2017-11-28 DIAGNOSIS — I129 Hypertensive chronic kidney disease with stage 1 through stage 4 chronic kidney disease, or unspecified chronic kidney disease: Secondary | ICD-10-CM | POA: Diagnosis not present

## 2017-11-28 DIAGNOSIS — E039 Hypothyroidism, unspecified: Secondary | ICD-10-CM | POA: Diagnosis not present

## 2017-11-28 LAB — CBC WITH DIFFERENTIAL/PLATELET
BASOS PCT: 1 %
Basophils Absolute: 0.1 10*3/uL (ref 0–0.1)
Eosinophils Absolute: 0.3 10*3/uL (ref 0–0.7)
Eosinophils Relative: 2 %
HEMATOCRIT: 34.8 % — AB (ref 40.0–52.0)
HEMOGLOBIN: 11.7 g/dL — AB (ref 13.0–18.0)
LYMPHS PCT: 10 %
Lymphs Abs: 1.4 10*3/uL (ref 1.0–3.6)
MCH: 29.8 pg (ref 26.0–34.0)
MCHC: 33.6 g/dL (ref 32.0–36.0)
MCV: 88.7 fL (ref 80.0–100.0)
MONO ABS: 1 10*3/uL (ref 0.2–1.0)
MONOS PCT: 7 %
NEUTROS ABS: 11.7 10*3/uL — AB (ref 1.4–6.5)
NEUTROS PCT: 80 %
Platelets: 188 10*3/uL (ref 150–440)
RBC: 3.92 MIL/uL — ABNORMAL LOW (ref 4.40–5.90)
RDW: 15.6 % — AB (ref 11.5–14.5)
WBC: 14.6 10*3/uL — ABNORMAL HIGH (ref 3.8–10.6)

## 2017-11-28 NOTE — Progress Notes (Signed)
Patient here today for follow up.   

## 2017-11-29 DIAGNOSIS — I480 Paroxysmal atrial fibrillation: Secondary | ICD-10-CM | POA: Diagnosis not present

## 2017-11-29 DIAGNOSIS — I4891 Unspecified atrial fibrillation: Secondary | ICD-10-CM | POA: Diagnosis not present

## 2017-11-29 LAB — PROTEIN ELECTRO, RANDOM URINE
ALBUMIN ELP UR: 26.5 %
ALPHA-2-GLOBULIN, U: 21.5 %
Alpha-1-Globulin, U: 7.7 %
BETA GLOBULIN, U: 21.4 %
GAMMA GLOBULIN, U: 22.9 %
TOTAL PROTEIN, URINE-UPE24: 25.8 mg/dL

## 2017-11-29 LAB — EPSTEIN-BARR VIRUS VCA ANTIBODY PANEL
EBV Early Antigen Ab, IgG: <9 U/mL (ref 0.0–8.9)
EBV VCA IgG: >600 U/mL — ABNORMAL HIGH (ref 0.0–17.9)
EBV VCA IgM: <36 U/mL (ref 0.0–35.9)

## 2017-12-01 DIAGNOSIS — I129 Hypertensive chronic kidney disease with stage 1 through stage 4 chronic kidney disease, or unspecified chronic kidney disease: Secondary | ICD-10-CM | POA: Diagnosis not present

## 2017-12-01 DIAGNOSIS — N184 Chronic kidney disease, stage 4 (severe): Secondary | ICD-10-CM | POA: Diagnosis not present

## 2017-12-01 DIAGNOSIS — E039 Hypothyroidism, unspecified: Secondary | ICD-10-CM | POA: Diagnosis not present

## 2017-12-01 DIAGNOSIS — R112 Nausea with vomiting, unspecified: Secondary | ICD-10-CM | POA: Diagnosis not present

## 2017-12-01 DIAGNOSIS — R634 Abnormal weight loss: Secondary | ICD-10-CM | POA: Diagnosis not present

## 2017-12-01 DIAGNOSIS — D72829 Elevated white blood cell count, unspecified: Secondary | ICD-10-CM | POA: Diagnosis not present

## 2017-12-02 ENCOUNTER — Inpatient Hospital Stay: Payer: Medicare Other

## 2017-12-02 DIAGNOSIS — D729 Disorder of white blood cells, unspecified: Secondary | ICD-10-CM

## 2017-12-03 ENCOUNTER — Other Ambulatory Visit: Payer: Self-pay

## 2017-12-03 LAB — IMMUNOFIXATION, URINE

## 2017-12-03 NOTE — Progress Notes (Signed)
Attempted to schedule ct chest w/o contrast due to patient's creatinine level.  Insurance will not approve.

## 2017-12-04 ENCOUNTER — Ambulatory Visit: Payer: No Typology Code available for payment source | Admitting: Gastroenterology

## 2017-12-05 ENCOUNTER — Emergency Department: Payer: Medicare Other

## 2017-12-05 ENCOUNTER — Ambulatory Visit: Payer: No Typology Code available for payment source | Admitting: Oncology

## 2017-12-05 ENCOUNTER — Encounter: Payer: Self-pay | Admitting: Emergency Medicine

## 2017-12-05 ENCOUNTER — Other Ambulatory Visit: Payer: Self-pay

## 2017-12-05 ENCOUNTER — Observation Stay
Admission: EM | Admit: 2017-12-05 | Discharge: 2017-12-07 | Disposition: A | Discharge status: Home / Self Care | Payer: Medicare Other | Attending: Family Medicine | Admitting: Family Medicine

## 2017-12-05 DIAGNOSIS — D123 Benign neoplasm of transverse colon: Secondary | ICD-10-CM | POA: Diagnosis not present

## 2017-12-05 DIAGNOSIS — I2510 Atherosclerotic heart disease of native coronary artery without angina pectoris: Secondary | ICD-10-CM | POA: Diagnosis not present

## 2017-12-05 DIAGNOSIS — E079 Disorder of thyroid, unspecified: Secondary | ICD-10-CM | POA: Insufficient documentation

## 2017-12-05 DIAGNOSIS — I4891 Unspecified atrial fibrillation: Secondary | ICD-10-CM | POA: Diagnosis not present

## 2017-12-05 DIAGNOSIS — R42 Dizziness and giddiness: Secondary | ICD-10-CM | POA: Diagnosis not present

## 2017-12-05 DIAGNOSIS — I129 Hypertensive chronic kidney disease with stage 1 through stage 4 chronic kidney disease, or unspecified chronic kidney disease: Secondary | ICD-10-CM | POA: Insufficient documentation

## 2017-12-05 DIAGNOSIS — D124 Benign neoplasm of descending colon: Secondary | ICD-10-CM | POA: Diagnosis not present

## 2017-12-05 DIAGNOSIS — K644 Residual hemorrhoidal skin tags: Secondary | ICD-10-CM | POA: Diagnosis not present

## 2017-12-05 DIAGNOSIS — E43 Unspecified severe protein-calorie malnutrition: Secondary | ICD-10-CM

## 2017-12-05 DIAGNOSIS — K7689 Other specified diseases of liver: Secondary | ICD-10-CM | POA: Diagnosis not present

## 2017-12-05 DIAGNOSIS — R590 Localized enlarged lymph nodes: Secondary | ICD-10-CM | POA: Diagnosis not present

## 2017-12-05 DIAGNOSIS — Z6820 Body mass index (BMI) 20.0-20.9, adult: Secondary | ICD-10-CM | POA: Diagnosis not present

## 2017-12-05 DIAGNOSIS — Z87891 Personal history of nicotine dependence: Secondary | ICD-10-CM | POA: Insufficient documentation

## 2017-12-05 DIAGNOSIS — D122 Benign neoplasm of ascending colon: Secondary | ICD-10-CM | POA: Diagnosis not present

## 2017-12-05 DIAGNOSIS — N183 Chronic kidney disease, stage 3 (moderate): Secondary | ICD-10-CM | POA: Diagnosis not present

## 2017-12-05 DIAGNOSIS — R634 Abnormal weight loss: Secondary | ICD-10-CM | POA: Diagnosis not present

## 2017-12-05 DIAGNOSIS — D62 Acute posthemorrhagic anemia: Secondary | ICD-10-CM | POA: Insufficient documentation

## 2017-12-05 DIAGNOSIS — R110 Nausea: Secondary | ICD-10-CM | POA: Insufficient documentation

## 2017-12-05 DIAGNOSIS — K922 Gastrointestinal hemorrhage, unspecified: Secondary | ICD-10-CM | POA: Diagnosis not present

## 2017-12-05 DIAGNOSIS — K921 Melena: Secondary | ICD-10-CM | POA: Diagnosis not present

## 2017-12-05 DIAGNOSIS — K625 Hemorrhage of anus and rectum: Principal | ICD-10-CM | POA: Insufficient documentation

## 2017-12-05 DIAGNOSIS — I739 Peripheral vascular disease, unspecified: Secondary | ICD-10-CM | POA: Diagnosis not present

## 2017-12-05 DIAGNOSIS — J439 Emphysema, unspecified: Secondary | ICD-10-CM | POA: Insufficient documentation

## 2017-12-05 DIAGNOSIS — D72829 Elevated white blood cell count, unspecified: Secondary | ICD-10-CM | POA: Insufficient documentation

## 2017-12-05 DIAGNOSIS — Z79899 Other long term (current) drug therapy: Secondary | ICD-10-CM | POA: Insufficient documentation

## 2017-12-05 DIAGNOSIS — I10 Essential (primary) hypertension: Secondary | ICD-10-CM | POA: Diagnosis not present

## 2017-12-05 DIAGNOSIS — K621 Rectal polyp: Secondary | ICD-10-CM | POA: Diagnosis not present

## 2017-12-05 LAB — COMPREHENSIVE METABOLIC PANEL
ALT: 53 U/L (ref 17–63)
AST: 113 U/L — ABNORMAL HIGH (ref 15–41)
Albumin: 2.5 g/dL — ABNORMAL LOW (ref 3.5–5.0)
Alkaline Phosphatase: 234 U/L — ABNORMAL HIGH (ref 38–126)
Anion gap: 10 (ref 5–15)
BILIRUBIN TOTAL: 0.8 mg/dL (ref 0.3–1.2)
BUN: 26 mg/dL — ABNORMAL HIGH (ref 6–20)
CHLORIDE: 109 mmol/L (ref 101–111)
CO2: 21 mmol/L — ABNORMAL LOW (ref 22–32)
Calcium: 8 mg/dL — ABNORMAL LOW (ref 8.9–10.3)
Creatinine, Ser: 1.75 mg/dL — ABNORMAL HIGH (ref 0.61–1.24)
GFR, EST AFRICAN AMERICAN: 43 mL/min — AB (ref >60)
GFR, EST NON AFRICAN AMERICAN: 37 mL/min — AB (ref >60)
Glucose, Bld: 120 mg/dL — ABNORMAL HIGH (ref 65–99)
POTASSIUM: 4.3 mmol/L (ref 3.5–5.1)
Sodium: 140 mmol/L (ref 135–145)
TOTAL PROTEIN: 6.2 g/dL — AB (ref 6.5–8.1)

## 2017-12-05 LAB — TYPE AND SCREEN
ABO/RH(D): A POS
Antibody Screen: NEGATIVE

## 2017-12-05 LAB — HEMOGLOBIN: HEMOGLOBIN: 9.5 g/dL — AB (ref 13.0–18.0)

## 2017-12-05 LAB — CBC
HCT: 34.5 % — ABNORMAL LOW (ref 40.0–52.0)
Hemoglobin: 11.3 g/dL — ABNORMAL LOW (ref 13.0–18.0)
MCH: 29.2 pg (ref 26.0–34.0)
MCHC: 32.7 g/dL (ref 32.0–36.0)
MCV: 89.3 fL (ref 80.0–100.0)
PLATELETS: 226 10*3/uL (ref 150–440)
RBC: 3.86 MIL/uL — ABNORMAL LOW (ref 4.40–5.90)
RDW: 16.1 % — AB (ref 11.5–14.5)
WBC: 16.6 10*3/uL — ABNORMAL HIGH (ref 3.8–10.6)

## 2017-12-05 LAB — PROTIME-INR
INR: 1.07
PROTHROMBIN TIME: 13.8 s (ref 11.4–15.2)

## 2017-12-05 LAB — APTT: APTT: 36 s (ref 24–36)

## 2017-12-05 MED ORDER — FOLIC ACID 1 MG PO TABS
1.0000 mg | ORAL_TABLET | Freq: Every day | ORAL | Status: DC
  Administered 2017-12-06 – 2017-12-07 (×2): 1 mg via ORAL
  Filled 2017-12-05 (×2): qty 1

## 2017-12-05 MED ORDER — SODIUM CHLORIDE 0.9 % IV SOLN
1000.0000 mL | Freq: Once | INTRAVENOUS | Status: AC
  Administered 2017-12-05: 1000 mL via INTRAVENOUS

## 2017-12-05 MED ORDER — AMIODARONE HCL 200 MG PO TABS
200.0000 mg | ORAL_TABLET | Freq: Every day | ORAL | Status: DC
  Administered 2017-12-06 – 2017-12-07 (×2): 200 mg via ORAL
  Filled 2017-12-05 (×2): qty 1

## 2017-12-05 MED ORDER — PANTOPRAZOLE SODIUM 40 MG PO TBEC
40.0000 mg | DELAYED_RELEASE_TABLET | Freq: Every day | ORAL | Status: DC
  Administered 2017-12-06 – 2017-12-07 (×2): 40 mg via ORAL
  Filled 2017-12-05 (×2): qty 1

## 2017-12-05 MED ORDER — ISOSORBIDE MONONITRATE ER 30 MG PO TB24
30.0000 mg | ORAL_TABLET | Freq: Every day | ORAL | Status: DC
  Administered 2017-12-05 – 2017-12-07 (×3): 30 mg via ORAL
  Filled 2017-12-05 (×3): qty 1

## 2017-12-05 MED ORDER — ACETAMINOPHEN 650 MG RE SUPP: 650.0000 mg | Freq: Four times a day (QID) | RECTAL | Status: DC | PRN

## 2017-12-05 MED ORDER — SODIUM CHLORIDE 0.9 % IV SOLN
INTRAVENOUS | Status: DC
  Administered 2017-12-05 – 2017-12-07 (×4): via INTRAVENOUS

## 2017-12-05 MED ORDER — LEVOTHYROXINE SODIUM 50 MCG PO TABS
75.0000 ug | ORAL_TABLET | Freq: Every day | ORAL | Status: DC
  Administered 2017-12-06 – 2017-12-07 (×2): 75 ug via ORAL
  Filled 2017-12-05 (×3): qty 2

## 2017-12-05 MED ORDER — ALBUTEROL SULFATE (2.5 MG/3ML) 0.083% IN NEBU: 2.5000 mg | INHALATION_SOLUTION | RESPIRATORY_TRACT | Status: DC | PRN

## 2017-12-05 MED ORDER — LISINOPRIL 10 MG PO TABS
5.0000 mg | ORAL_TABLET | Freq: Every day | ORAL | Status: DC
  Administered 2017-12-05 – 2017-12-07 (×3): 5 mg via ORAL
  Filled 2017-12-05 (×3): qty 1

## 2017-12-05 MED ORDER — ONDANSETRON HCL 4 MG/2ML IJ SOLN: 4.0000 mg | Freq: Four times a day (QID) | INTRAMUSCULAR | Status: DC | PRN

## 2017-12-05 MED ORDER — ACETAMINOPHEN 325 MG PO TABS: 650.0000 mg | ORAL_TABLET | Freq: Four times a day (QID) | ORAL | Status: DC | PRN

## 2017-12-05 MED ORDER — HYDROCODONE-ACETAMINOPHEN 5-325 MG PO TABS: 1.0000 | ORAL_TABLET | ORAL | Status: DC | PRN

## 2017-12-05 MED ORDER — ATORVASTATIN CALCIUM 20 MG PO TABS
40.0000 mg | ORAL_TABLET | Freq: Every evening | ORAL | Status: DC
  Administered 2017-12-05 – 2017-12-06 (×2): 40 mg via ORAL
  Filled 2017-12-05 (×2): qty 2

## 2017-12-05 MED ORDER — IOPAMIDOL (ISOVUE-300) INJECTION 61%
60.0000 mL | Freq: Once | INTRAVENOUS | Status: AC | PRN
  Administered 2017-12-05: 60 mL via INTRAVENOUS

## 2017-12-05 MED ORDER — ONDANSETRON HCL 4 MG PO TABS
4.0000 mg | ORAL_TABLET | Freq: Four times a day (QID) | ORAL | Status: DC | PRN
  Administered 2017-12-06 – 2017-12-07 (×2): 4 mg via ORAL
  Filled 2017-12-05 (×2): qty 1

## 2017-12-05 NOTE — ED Triage Notes (Signed)
Patient presents to ED via POV from home with c/o rectal bleeding x 1 week. Patient was told by his GI doctor to come and have his hemoglobin checked. Patient reports bright red bleed with dark stools.

## 2017-12-05 NOTE — Consult Note (Signed)
Jonathon Bellows , MD [address], [place], [place], [place], [address] [address] [place], [place], [place], [address] Phone: [phone]  Fax: [phone]  Consultation  Referring Provider:  ER  Primary Care Physician:  Lavera Guise, MD Primary Gastroenterologist:  Dr. Vicente Males          Reason for Consultation:     Rectal bleeding   Date of Admission:  12/05/2017 Date of Consultation:  12/05/2017         HPI:   Gopal Malter is a 72 y.o. male   I performed an EGD and colonoscopy on 11/25/17 for unintentional weight loss.   EGD showed some gastritis which was biopsied. A polyp was seen in the duodenum which was sample biopsied and revealed a tubular adenoma.   I also performed a colonoscopy and resected 13 polyps , clips were placed on one of the polyps which was slightly larger in size.   He had been on asprin and plavix prior to the procedure.   All polyps except two in the rectum were adenomas .   Today he returned to out office earlier today with complaints of rectal bleeding. I advised him on the phone to go to the ER to get evaluated. He says he has not taken any asprin or plavix since the procedure, had no issues for a week and then subsequently noticed dark red stool and often after a bowel movement would notice red stool. Last episode occurred in the night.   He says he continues to lose weight which prompted these endoscopies. Denies any abdominal pain .   Past Medical History:  Diagnosis Date  . Coronary artery disease   . Hypertension   . Renal disorder   . Thyroid disease     Past Surgical History:  Procedure Laterality Date  . COLONOSCOPY WITH PROPOFOL N/A 11/25/2017   Procedure: COLONOSCOPY WITH PROPOFOL;  Surgeon: Jonathon Bellows, MD;  Location: [facility] ENDOSCOPY;  Service: Gastroenterology;  Laterality: N/A;  . ESOPHAGOGASTRODUODENOSCOPY (EGD) WITH PROPOFOL N/A 11/25/2017   Procedure: ESOPHAGOGASTRODUODENOSCOPY (EGD) WITH PROPOFOL;  Surgeon: Jonathon Bellows,  MD;  Location: [facility] ENDOSCOPY;  Service: Gastroenterology;  Laterality: N/A;    Prior to Admission medications   Medication Sig Start Date End Date Taking? Authorizing Provider  amiodarone (PACERONE) 200 MG tablet  03/21/17   [provider]  apixaban (ELIQUIS) 5 MG TABS tablet Take by mouth. 10/31/17   [provider]  atorvastatin (LIPITOR) 40 MG tablet  04/28/17   [provider]  folic acid (FOLVITE) 1 MG tablet Take 1 tablet (1 mg total) by mouth daily. 11/07/17   Earlie Server, MD  isosorbide mononitrate (IMDUR) 30 MG 24 hr tablet  04/28/17   [provider]  levothyroxine (SYNTHROID, LEVOTHROID) 75 MCG tablet  04/08/17   [provider]  lisinopril (PRINIVIL,ZESTRIL) 5 MG tablet  04/28/17   [provider]  Omega-3 Fatty Acids (FISH OIL) 1000 MG CAPS Take 1,000 mg by mouth daily.    [provider]  omeprazole (PRILOSEC) 40 MG capsule Take 1 capsule (40 mg total) by mouth daily. 11/25/17 11/25/18  Jonathon Bellows, MD  ondansetron (ZOFRAN) 4 MG tablet Take 1 tablet (4 mg total) by mouth every 12 (twelve) hours as needed for nausea or vomiting. 10/24/17   Earlie Server, MD    Family History  Problem Relation Age of Onset  . Pneumonia Father   . Heart attack Brother      Social History  Tobacco Use  . Smoking status: Former Smoker    Years: 20.00  . Smokeless tobacco: Never Used  . Tobacco comment: 10 years ago  Substance Use Topics  . Alcohol use: No    Frequency: Never  . Drug use: No    Allergies as of 12/05/2017  . (No Known Allergies)    Review of Systems:    All systems reviewed and negative except where noted in HPI.   Physical Exam:  Vital signs in last 24 hours: Temp:  [97.4 F (36.3 C)] 97.4 F (36.3 C) (01/31 1016) Pulse Rate:  [57] 57 (01/31 1016) Resp:  [17] 17 (01/31 1016) BP: (148)/(70) 148/70 (01/31 1016) SpO2:  [99 %] 99 % (01/31 1016) Weight:  [147 lb (66.7 kg)] 147 lb (66.7 kg) (01/31 1016)   General:    Pleasant, cooperative in NAD Head:  Normocephalic and atraumatic. Eyes:   No icterus.   Conjunctiva pink. PERRLA. Ears:  Normal auditory acuity. Neck:  Supple; no masses or thyroidomegaly Lungs: Respirations even and unlabored. Lungs clear to auscultation bilaterally.   No wheezes, crackles, or rhonchi.  Heart:  Regular rate and rhythm;  Without murmur, clicks, rubs or gallops Abdomen:  Soft, nondistended, nontender. Normal bowel sounds. No appreciable masses or hepatomegaly.  No rebound or guarding.  Neurologic:  Alert and oriented x3;  grossly normal neurologically. Skin:  Intact without significant lesions or rashes. Cervical Nodes:  No significant cervical adenopathy. Psych:  Alert and cooperative. Normal affect.  LAB RESULTS: No results for input(s): WBC, HGB, HCT, PLT in the last 72 hours. BMET No results for input(s): NA, K, CL, CO2, GLUCOSE, BUN, CREATININE, CALCIUM in the last 72 hours. LFT No results for input(s): PROT, ALBUMIN, AST, ALT, ALKPHOS, BILITOT, BILIDIR, IBILI in the last 72 hours. PT/INR No results for input(s): LABPROT, INR in the last 72 hours.  STUDIES: No results found.    Impression / Plan:   Neri Samek is a 72 y.o. y/o male presents to the ER 10 days after his recent EGD+colonoscopy where he had about 13 polyps taken out from his colon. It is very likely he has had a post polypectomy bleed.   Plan  1. Check CBC 2. Prep colon with golytely- if the stool appears yellow and no blood seen then suggests the bleeding has stopped . If continues to be red in color suggests ongoing bleed and he will need a colonoscopy tomorrow.  3. Dr Allen Norris will follow him while in the hospital.  4. He follows with me and DR Tasia Catchings in oncology as an outpatient where we are investigating for unitentional weight loss which has bee significant. Ct abdomen in 10/2017 was negative- suggest CT scan of the chest.     Thank you for involving me in the care of this patient.       LOS: 0 days   Jonathon Bellows, MD  12/05/2017, 10:21 AM

## 2017-12-05 NOTE — ED Provider Notes (Signed)
[facility] Emergency Department Provider Note   ____________________________________________    I have reviewed the triage vital signs and the nursing notes.   HISTORY  Chief Complaint Rectal Bleeding     HPI Alan Bennett is a 72 y.o. male who presents with rectal bleeding.  Patient reports he had a colonoscopy 2 weeks ago at which point some polyps were removed by his GI Dr. Vicente Males.  Over the last 5 days he has developed dark stool and occasionally seen dark red blood in it.  Denies abdominal pain.  Does report significant weight loss over the last 4 months.  Has been working with his GI doctor to determine the cause of weight loss.  He is not on blood thinners   Past Medical History:  Diagnosis Date  . Coronary artery disease   . Hypertension   . Renal disorder   . Thyroid disease     Patient Active Problem List   Diagnosis Date Noted  . Rectal bleeding 12/05/2017  . Carotid stenosis 06/17/2017  . Atherosclerosis of artery of extremity with intermittent claudication ([place]) 06/17/2017  . A-fib ([place]) 06/17/2017  . Essential hypertension 06/17/2017  . CAD (coronary artery disease) 06/17/2017  . Stress fracture of right femur with routine healing 12/25/2016  . Primary osteoarthritis of right knee 12/27/2014    Past Surgical History:  Procedure Laterality Date  . COLONOSCOPY WITH PROPOFOL N/A 11/25/2017   Procedure: COLONOSCOPY WITH PROPOFOL;  Surgeon: Jonathon Bellows, MD;  Location: [facility] ENDOSCOPY;  Service: Gastroenterology;  Laterality: N/A;  . ESOPHAGOGASTRODUODENOSCOPY (EGD) WITH PROPOFOL N/A 11/25/2017   Procedure: ESOPHAGOGASTRODUODENOSCOPY (EGD) WITH PROPOFOL;  Surgeon: Jonathon Bellows, MD;  Location: [facility] ENDOSCOPY;  Service: Gastroenterology;  Laterality: N/A;    Prior to Admission medications   Medication Sig Start Date End Date Taking? Authorizing Provider  amiodarone (PACERONE) 200 MG tablet Take 200 mg by mouth daily.  03/21/17  Yes  [provider]  atorvastatin (LIPITOR) 40 MG tablet Take 40 mg by mouth every evening.  04/28/17  Yes [provider]  folic acid (FOLVITE) 1 MG tablet Take 1 tablet (1 mg total) by mouth daily. 11/07/17  Yes Earlie Server, MD  isosorbide mononitrate (IMDUR) 30 MG 24 hr tablet Take 30 mg by mouth daily.  04/28/17  Yes [provider]  levothyroxine (SYNTHROID, LEVOTHROID) 75 MCG tablet Take 75 mcg by mouth daily.  04/08/17  Yes [provider]  lisinopril (PRINIVIL,ZESTRIL) 5 MG tablet Take 5 mg by mouth daily.  04/28/17  Yes [provider]  Omega-3 Fatty Acids (FISH OIL) 1000 MG CAPS Take 1,000 mg by mouth daily.   Yes [provider]  omeprazole (PRILOSEC) 40 MG capsule Take 1 capsule (40 mg total) by mouth daily. 11/25/17 11/25/18 Yes Jonathon Bellows, MD  ondansetron (ZOFRAN) 4 MG tablet Take 1 tablet (4 mg total) by mouth every 12 (twelve) hours as needed for nausea or vomiting. 10/24/17  Yes Earlie Server, MD     Allergies Patient has no known allergies.  Family History  Problem Relation Age of Onset  . Pneumonia Father   . Heart attack Brother     Social History Social History   Tobacco Use  . Smoking status: Former Smoker    Years: 20.00  . Smokeless tobacco: Never Used  . Tobacco comment: 10 years ago  Substance Use Topics  . Alcohol use: No    Frequency: Never  . Drug use: No    Review of Systems  Constitutional: Some dizziness Eyes: No visual changes.  ENT: No sore throat. Cardiovascular: Denies chest pain. Respiratory: Denies shortness of breath. Gastrointestinal: No abdominal pain.  Rectal bleeding as above Genitourinary: Negative for dysuria. Musculoskeletal: Negative for back pain. Skin: Negative for rash. Neurological: Negative for headaches    ____________________________________________   PHYSICAL EXAM:  VITAL SIGNS: ED Triage Vitals [12/05/17 1016]  Enc Vitals Group     BP (!) 148/70     Pulse Rate (!) 57      Resp 17     Temp (!) 97.4 F (36.3 C)     Temp src      SpO2 99 %     Weight 66.7 kg (147 lb)     Height      Head Circumference      Peak Flow      Pain Score 0     Pain Loc      Pain Edu?      Excl. in [place]?     Constitutional: Alert and oriented. No acute distress. Pleasant and interactive Eyes: Conjunctivae are normal.   Nose: No congestion/rhinnorhea. Mouth/Throat: Mucous membranes are moist.    Cardiovascular: Normal rate, regular rhythm. Grossly normal heart sounds.  Good peripheral circulation. Respiratory: Normal respiratory effort.  No retractions. Lungs CTAB. Gastrointestinal: Soft and nontender. No distention.  No CVA tenderness.  Black stool with dark reddish blood  Musculoskeletal:  Warm and well perfused Neurologic:  Normal speech and language. No gross focal neurologic deficits are appreciated.  Skin:  Skin is warm, dry and intact. No rash noted. Psychiatric: Mood and affect are normal. Speech and behavior are normal.  ____________________________________________   LABS (all labs ordered are listed, but only abnormal results are displayed)  Labs Reviewed  CBC - Abnormal; Notable for the following components:      Result Value   WBC 16.6 (*)    RBC 3.86 (*)    Hemoglobin 11.3 (*)    HCT 34.5 (*)    RDW 16.1 (*)    All other components within normal limits  COMPREHENSIVE METABOLIC PANEL - Abnormal; Notable for the following components:   CO2 21 (*)    Glucose, Bld 120 (*)    BUN 26 (*)    Creatinine, Ser 1.75 (*)    Calcium 8.0 (*)    Total Protein 6.2 (*)    Albumin 2.5 (*)    AST 113 (*)    Alkaline Phosphatase 234 (*)    GFR calc non Af Amer 37 (*)    GFR calc Af Amer 43 (*)    All other components within normal limits  APTT  PROTIME-INR  TYPE AND SCREEN   ____________________________________________  EKG  None  ____________________________________________  RADIOLOGY  CT chest requested by GI, no acute  distress ____________________________________________   PROCEDURES  Procedure(s) performed: No  Procedures   Critical Care performed: No ____________________________________________   INITIAL IMPRESSION / ASSESSMENT AND PLAN / ED COURSE  Pertinent labs & imaging results that were available during my care of the patient were reviewed by me and considered in my medical decision making (see chart for details).  Presents with GI bleed, not on blood thinners.  Hemoglobin mildly decreased.  Seen by Dr. Vicente Males in the emergency department who requests admission and CT chest.  Discussed with hospitalist service for admission    ____________________________________________   FINAL CLINICAL IMPRESSION(S) / ED DIAGNOSES  Final diagnoses:  Acute GI bleeding  Note:  This document was prepared using Dragon voice recognition software and may include unintentional dictation errors.    Lavonia Drafts, MD 12/05/17 1253

## 2017-12-05 NOTE — H&P (Signed)
Alan Bennett at [place] NAME: Alan Bennett    MR#:  [id number]  DATE OF BIRTH:  08-May-1946  DATE OF ADMISSION:  12/05/2017  PRIMARY CARE PHYSICIAN: Lavera Guise, MD   REQUESTING/REFERRING PHYSICIAN: Lavonia Drafts, MD  CHIEF COMPLAINT:   Chief Complaint  Patient presents with  . Rectal Bleeding   Rectal bleeding and weight loss.  HISTORY OF PRESENT ILLNESS:  Alan Bennett  is a 72 y.o. male with a known history of CAD, hypertension, CKD stage III and thyroid disease.  The patient got colonoscopy and polyps removed 2 weeks ago.  He complains of dark stool in the rectal bleeding for the past 5 days.  He denies any abdominal pain, nausea, vomiting or diarrhea.  But he lost weight for the past 4 months.  Dr. Vicente Males, GI physician requested admission and get a CAT scan of the chest. PAST MEDICAL HISTORY:   Past Medical History:  Diagnosis Date  . Coronary artery disease   . Hypertension   . Renal disorder   . Thyroid disease     PAST SURGICAL HISTORY:   Past Surgical History:  Procedure Laterality Date  . COLONOSCOPY WITH PROPOFOL N/A 11/25/2017   Procedure: COLONOSCOPY WITH PROPOFOL;  Surgeon: Jonathon Bellows, MD;  Location: [facility] ENDOSCOPY;  Service: Gastroenterology;  Laterality: N/A;  . ESOPHAGOGASTRODUODENOSCOPY (EGD) WITH PROPOFOL N/A 11/25/2017   Procedure: ESOPHAGOGASTRODUODENOSCOPY (EGD) WITH PROPOFOL;  Surgeon: Jonathon Bellows, MD;  Location: [facility] ENDOSCOPY;  Service: Gastroenterology;  Laterality: N/A;    SOCIAL HISTORY:   Social History   Tobacco Use  . Smoking status: Former Smoker    Years: 20.00  . Smokeless tobacco: Never Used  . Tobacco comment: 10 years ago  Substance Use Topics  . Alcohol use: No    Frequency: Never    FAMILY HISTORY:   Family History  Problem Relation Age of Onset  . Pneumonia Father   . Heart attack Brother     DRUG ALLERGIES:  No Known Allergies  REVIEW OF SYSTEMS:   Review of Systems    Constitutional: Positive for weight loss. Negative for chills, fever and malaise/fatigue.  HENT: Negative for sore throat.   Eyes: Negative for blurred vision and double vision.  Respiratory: Negative for cough, hemoptysis, shortness of breath, wheezing and stridor.   Cardiovascular: Negative for chest pain, palpitations, orthopnea and leg swelling.  Gastrointestinal: Positive for blood in stool. Negative for abdominal pain, diarrhea, melena, nausea and vomiting.  Genitourinary: Negative for dysuria, flank pain and hematuria.  Musculoskeletal: Negative for back pain and joint pain.  Skin: Negative for rash.  Neurological: Negative for dizziness, sensory change, focal weakness, seizures, loss of consciousness, weakness and headaches.  Endo/Heme/Allergies: Negative for polydipsia.  Psychiatric/Behavioral: Negative for depression. The patient is not nervous/anxious.     MEDICATIONS AT HOME:   Prior to Admission medications   Medication Sig Start Date End Date Taking? Authorizing Provider  amiodarone (PACERONE) 200 MG tablet Take 200 mg by mouth daily.  03/21/17  Yes [provider]  atorvastatin (LIPITOR) 40 MG tablet Take 40 mg by mouth every evening.  04/28/17  Yes [provider]  folic acid (FOLVITE) 1 MG tablet Take 1 tablet (1 mg total) by mouth daily. 11/07/17  Yes Earlie Server, MD  isosorbide mononitrate (IMDUR) 30 MG 24 hr tablet Take 30 mg by mouth daily.  04/28/17  Yes [provider]  levothyroxine (SYNTHROID, LEVOTHROID) 75 MCG tablet Take 75 mcg by mouth  daily.  04/08/17  Yes [provider]  lisinopril (PRINIVIL,ZESTRIL) 5 MG tablet Take 5 mg by mouth daily.  04/28/17  Yes [provider]  Omega-3 Fatty Acids (FISH OIL) 1000 MG CAPS Take 1,000 mg by mouth daily.   Yes [provider]  omeprazole (PRILOSEC) 40 MG capsule Take 1 capsule (40 mg total) by mouth daily. 11/25/17 11/25/18 Yes Jonathon Bellows, MD  ondansetron (ZOFRAN) 4 MG tablet  Take 1 tablet (4 mg total) by mouth every 12 (twelve) hours as needed for nausea or vomiting. 10/24/17  Yes Earlie Server, MD      VITAL SIGNS:  Blood pressure (!) 153/55, pulse (!) 42, temperature (!) 97.4 F (36.3 C), resp. rate 18, weight 147 lb (66.7 kg), SpO2 95 %.  PHYSICAL EXAMINATION:  Physical Exam  GENERAL:  71 y.o.-year-old patient lying in the bed with no acute distress.  EYES: Pupils equal, round, reactive to light and accommodation. No scleral icterus. Extraocular muscles intact.  HEENT: Head atraumatic, normocephalic. Oropharynx and nasopharynx clear.  NECK:  Supple, no jugular venous distention. No thyroid enlargement, no tenderness.  LUNGS: Normal breath sounds bilaterally, no wheezing, rales,rhonchi or crepitation. No use of accessory muscles of respiration.  CARDIOVASCULAR: S1, S2 normal. No murmurs, rubs, or gallops.  ABDOMEN: Soft, nontender, nondistended. Bowel sounds present. No organomegaly or mass.  EXTREMITIES: No pedal edema, cyanosis, or clubbing.  NEUROLOGIC: Cranial nerves II through XII are intact. Muscle strength 5/5 in all extremities. Sensation intact. Gait not checked.  PSYCHIATRIC: The patient is alert and oriented x 3.  SKIN: No obvious rash, lesion, or ulcer.   LABORATORY PANEL:   CBC Recent Labs  Lab 12/05/17 1027  WBC 16.6*  HGB 11.3*  HCT 34.5*  PLT 226   ------------------------------------------------------------------------------------------------------------------  Chemistries  Recent Labs  Lab 12/05/17 1027  NA 140  K 4.3  CL 109  CO2 21*  GLUCOSE 120*  BUN 26*  CREATININE 1.75*  CALCIUM 8.0*  AST 113*  ALT 53  ALKPHOS 234*  BILITOT 0.8   ------------------------------------------------------------------------------------------------------------------  Cardiac Enzymes No results for input(s): TROPONINI in the last 168  hours. ------------------------------------------------------------------------------------------------------------------  RADIOLOGY:  Ct Chest W Contrast  Result Date: 12/05/2017 CLINICAL DATA:  Elevated white blood cell count and weight loss EXAM: CT CHEST WITH CONTRAST TECHNIQUE: Multidetector CT imaging of the chest was performed during intravenous contrast administration. CONTRAST:  61mL ISOVUE-300 IOPAMIDOL (ISOVUE-300) INJECTION 61% COMPARISON:  11/28/2017 chest x-ray, CT of the abdomen 10/15/2017, CT of the chest 10/28/2008. FINDINGS: Cardiovascular: The aorta demonstrates atherosclerotic calcification without aneurysmal dilatation or dissection. No significant cardiac enlargement is noted. Coronary calcifications are seen. Pulmonary artery as visualized demonstrates no large central pulmonary embolus. The left vertebral artery arises directly from the aorta. Mediastinum/Nodes: Thoracic inlet is within normal limits. Scattered small mediastinal lymph nodes are noted. No significant hilar or mediastinal adenopathy is seen. The esophagus is within normal limits. Lungs/Pleura: Diffuse emphysematous changes are again identified. Mild scarring is noted in the right lung base posteriorly. No focal infiltrate or sizable effusion is seen. No parenchymal nodules are noted. Upper Abdomen: Left hepatic cyst is noted slightly larger than that seen on the prior exam now measuring 2.1 cm in greatest dimension. Mild nodularity to the liver is noted consistent with underlying cirrhotic change. No focal mass lesion is noted. Spleen is within normal limits. Scattered upper abdominal adenopathy is noted. The largest of these are noted in the portacaval space measuring 16 mm in short axis. Bilateral renal cystic  changes are noted. The right kidney is somewhat shrunken similar to that seen on prior exam. Musculoskeletal: Degenerative changes of the thoracic spine are noted. IMPRESSION: Mild increase in abdominal adenopathy  when compared with December of 2018. This is most notable in the portacaval space as described. Heterogeneity and nodularity within the liver consistent with underlying cirrhotic change. This is also more marked when compared with the prior exam. CT of the abdomen and pelvis may be helpful for further evaluation. No acute intrathoracic abnormality is seen. Aortic Atherosclerosis (ICD10-I70.0) and Emphysema (ICD10-J43.9). Electronically Signed   By: Inez Catalina M.D.   On: 12/05/2017 11:51      IMPRESSION AND PLAN:   Rectal bleeding, posterior colonoscopy with polypectomy. The patient will be placed for observation, follow hemoglobin every 6 hours, clear liquid diet, n.p.o. after midnight for possible colonoscopy in a.m. IV fluids support.  Follow-up with GI.  Hypertension, continue home hypertension medication. CKD stage III.  Stable. Weight loss.  CAT scan of the chest with contrast did not show any intrathoracic abnormality but has abdominal lymphadenopathy.  Follow-up oncologist as outpatient.  All the records are reviewed and case discussed with ED provider. Management plans discussed with the patient, his wife and they are in agreement.  CODE STATUS: Full codle  TOTAL TIME TAKING CARE OF THIS PATIENT: 53 minutes.    Demetrios Loll M.D on 12/05/2017 at 1:07 PM  Between 7am to 6pm - Pager - [phone]  After 6pm go to [email or web address] - password [email or web address] [facility]  [employer] [place] [facility]  Office  [phone]  CC: Primary care physician; Lavera Guise, MD   Note: This dictation was prepared with Dragon dictation along with smaller phrase technology. Any transcriptional errors that result from this process are unin

## 2017-12-06 ENCOUNTER — Other Ambulatory Visit: Payer: Self-pay | Admitting: *Deleted

## 2017-12-06 DIAGNOSIS — R634 Abnormal weight loss: Secondary | ICD-10-CM | POA: Diagnosis not present

## 2017-12-06 DIAGNOSIS — D649 Anemia, unspecified: Secondary | ICD-10-CM

## 2017-12-06 DIAGNOSIS — I10 Essential (primary) hypertension: Secondary | ICD-10-CM | POA: Diagnosis not present

## 2017-12-06 DIAGNOSIS — K922 Gastrointestinal hemorrhage, unspecified: Secondary | ICD-10-CM

## 2017-12-06 DIAGNOSIS — N183 Chronic kidney disease, stage 3 (moderate): Secondary | ICD-10-CM | POA: Diagnosis not present

## 2017-12-06 DIAGNOSIS — K625 Hemorrhage of anus and rectum: Secondary | ICD-10-CM | POA: Diagnosis not present

## 2017-12-06 LAB — CBC
HEMATOCRIT: 29.5 % — AB (ref 40.0–52.0)
Hemoglobin: 9.9 g/dL — ABNORMAL LOW (ref 13.0–18.0)
MCH: 30.1 pg (ref 26.0–34.0)
MCHC: 33.4 g/dL (ref 32.0–36.0)
MCV: 89.9 fL (ref 80.0–100.0)
Platelets: 181 10*3/uL (ref 150–440)
RBC: 3.28 MIL/uL — AB (ref 4.40–5.90)
RDW: 15.9 % — ABNORMAL HIGH (ref 11.5–14.5)
WBC: 13.8 10*3/uL — AB (ref 3.8–10.6)

## 2017-12-06 LAB — JAK2 V617F, W REFLEX TO CALR/E12/MPL

## 2017-12-06 LAB — BASIC METABOLIC PANEL
Anion gap: 7 (ref 5–15)
BUN: 18 mg/dL (ref 6–20)
CHLORIDE: 112 mmol/L — AB (ref 101–111)
CO2: 23 mmol/L (ref 22–32)
Calcium: 7.8 mg/dL — ABNORMAL LOW (ref 8.9–10.3)
Creatinine, Ser: 1.51 mg/dL — ABNORMAL HIGH (ref 0.61–1.24)
GFR calc non Af Amer: 44 mL/min — ABNORMAL LOW (ref >60)
GFR, EST AFRICAN AMERICAN: 51 mL/min — AB (ref >60)
Glucose, Bld: 85 mg/dL (ref 65–99)
POTASSIUM: 4.3 mmol/L (ref 3.5–5.1)
SODIUM: 142 mmol/L (ref 135–145)

## 2017-12-06 LAB — CALR + JAK2 E12-15 + MPL (REFLEXED)

## 2017-12-06 LAB — HEMOGLOBIN: Hemoglobin: 10.2 g/dL — ABNORMAL LOW (ref 13.0–18.0)

## 2017-12-06 MED ORDER — ADULT MULTIVITAMIN W/MINERALS CH
1.0000 | ORAL_TABLET | Freq: Every day | ORAL | Status: DC
  Administered 2017-12-06 – 2017-12-07 (×2): 1 via ORAL
  Filled 2017-12-06 (×2): qty 1

## 2017-12-06 MED ORDER — ENSURE ENLIVE PO LIQD
237.0000 mL | Freq: Three times a day (TID) | ORAL | Status: DC
  Administered 2017-12-06 – 2017-12-07 (×3): 237 mL via ORAL

## 2017-12-06 MED ORDER — PEG 3350-KCL-NA BICARB-NACL 420 G PO SOLR
4000.0000 mL | Freq: Once | ORAL | Status: AC
  Administered 2017-12-06: 4000 mL via ORAL
  Filled 2017-12-06: qty 4000

## 2017-12-06 NOTE — Care Management Obs Status (Signed)
[facility] NOTIFICATION   Patient Details  Name: Alan Bennett MRN: [id number] Date of Birth: 1946-01-01   Medicare Observation Status Notification Given:  Yes    Beverly Sessions, RN 12/06/2017, 5:27 PM

## 2017-12-06 NOTE — Progress Notes (Signed)
[facility] [place] NAME: Alan Bennett    MR#:  [id number]  DATE OF BIRTH:  [birth year]  SUBJECTIVE:  CHIEF COMPLAINT:   Chief Complaint  Patient presents with  . Rectal Bleeding   1 bowel movement overnight with dark and bright blood. No abdominal pain. No nausea or vomiting.  Afebrile.  REVIEW OF SYSTEMS:    Review of Systems  Constitutional: Positive for malaise/fatigue and weight loss. Negative for chills and fever.  HENT: Negative for sore throat.   Eyes: Negative for blurred vision, double vision and pain.  Respiratory: Negative for cough, hemoptysis, shortness of breath and wheezing.   Cardiovascular: Negative for chest pain, palpitations, orthopnea and leg swelling.  Gastrointestinal: Positive for blood in stool. Negative for abdominal pain, constipation, diarrhea, heartburn, nausea and vomiting.  Genitourinary: Negative for dysuria and hematuria.  Musculoskeletal: Negative for back pain and joint pain.  Skin: Negative for rash.  Neurological: Negative for sensory change, speech change, focal weakness and headaches.  Endo/Heme/Allergies: Does not bruise/bleed easily.  Psychiatric/Behavioral: Negative for depression. The patient is not nervous/anxious.     DRUG ALLERGIES:  No Known Allergies  VITALS:  Blood pressure (!) 174/61, pulse (!) 42, temperature 98 F (36.7 C), temperature source Oral, resp. rate 17, height 6\' 1"  (1.854 m), weight 66.1 kg (145 lb 12.8 oz), SpO2 97 %.  PHYSICAL EXAMINATION:   Physical Exam  GENERAL:  72 y.o.-year-old patient lying in the bed with no acute distress.  EYES: Pupils equal, round, reactive to light and accommodation. No scleral icterus. Extraocular muscles intact.  HEENT: Head atraumatic, normocephalic. Oropharynx and nasopharynx clear.  NECK:  Supple, no jugular venous distention. No thyroid enlargement, no tenderness.  LUNGS: Normal breath sounds bilaterally, no wheezing, rales,  rhonchi. No use of accessory muscles of respiration.  CARDIOVASCULAR: S1, S2 normal. No murmurs, rubs, or gallops.  ABDOMEN: Soft, nontender, nondistended. Bowel sounds present. No organomegaly or mass.  EXTREMITIES: No cyanosis, clubbing or edema b/l.    NEUROLOGIC: Cranial nerves II through XII are intact. No focal Motor or sensory deficits b/l.   PSYCHIATRIC: The patient is alert and oriented x 3.  SKIN: No obvious rash, lesion, or ulcer.   LABORATORY PANEL:   CBC Recent Labs  Lab 12/06/17 0519  WBC 13.8*  HGB 9.9*  HCT 29.5*  PLT 181   ------------------------------------------------------------------------------------------------------------------ Chemistries  Recent Labs  Lab 12/05/17 1027 12/06/17 0519  NA 140 142  K 4.3 4.3  CL 109 112*  CO2 21* 23  GLUCOSE 120* 85  BUN 26* 18  CREATININE 1.75* 1.51*  CALCIUM 8.0* 7.8*  AST 113*  --   ALT 53  --   ALKPHOS 234*  --   BILITOT 0.8  --    ------------------------------------------------------------------------------------------------------------------  Cardiac Enzymes No results for input(s): TROPONINI in the last 168 hours. ------------------------------------------------------------------------------------------------------------------  RADIOLOGY:  Ct Chest W Contrast  Result Date: 12/05/2017 CLINICAL DATA:  Elevated white blood cell count and weight loss EXAM: CT CHEST WITH CONTRAST TECHNIQUE: Multidetector CT imaging of the chest was performed during intravenous contrast administration. CONTRAST:  50mL ISOVUE-300 IOPAMIDOL (ISOVUE-300) INJECTION 61% COMPARISON:  11/28/2017 chest x-ray, CT of the abdomen 10/15/2017, CT of the chest 10/28/2008. FINDINGS: Cardiovascular: The aorta demonstrates atherosclerotic calcification without aneurysmal dilatation or dissection. No significant cardiac enlargement is noted. Coronary calcifications are seen. Pulmonary artery as visualized demonstrates no large central pulmonary  embolus. The left vertebral artery arises directly from the aorta. Mediastinum/Nodes: Thoracic  inlet is within normal limits. Scattered small mediastinal lymph nodes are noted. No significant hilar or mediastinal adenopathy is seen. The esophagus is within normal limits. Lungs/Pleura: Diffuse emphysematous changes are again identified. Mild scarring is noted in the right lung base posteriorly. No focal infiltrate or sizable effusion is seen. No parenchymal nodules are noted. Upper Abdomen: Left hepatic cyst is noted slightly larger than that seen on the prior exam now measuring 2.1 cm in greatest dimension. Mild nodularity to the liver is noted consistent with underlying cirrhotic change. No focal mass lesion is noted. Spleen is within normal limits. Scattered upper abdominal adenopathy is noted. The largest of these are noted in the portacaval space measuring 16 mm in short axis. Bilateral renal cystic changes are noted. The right kidney is somewhat shrunken similar to that seen on prior exam. Musculoskeletal: Degenerative changes of the thoracic spine are noted. IMPRESSION: Mild increase in abdominal adenopathy when compared with December of 2018. This is most notable in the portacaval space as described. Heterogeneity and nodularity within the liver consistent with underlying cirrhotic change. This is also more marked when compared with the prior exam. CT of the abdomen and pelvis may be helpful for further evaluation. No acute intrathoracic abnormality is seen. Aortic Atherosclerosis (ICD10-I70.0) and Emphysema (ICD10-J43.9). Electronically Signed   By: Inez Catalina M.D.   On: 12/05/2017 11:51     ASSESSMENT AND PLAN:   * Lower GI bleed Improving Waiting for GI to see. May need colonoscopy. Prep to be ordered by GI if needed  * Acute blood loss anemia Stable today Repeat labs in AM  * CKD3  Stable  * HTN Continue home medications  * Wt loss Patient is following with GI and Dr. [facility]asia [facility]ings at the  cancer center.  All the records are reviewed and case discussed with Care Management/Social Worker Management plans discussed with the patient, family and they are in agreement.  CODE STATUS: FULL CODE  DVT Prophylaxis: SCDs  TOTAL TIME TAKING CARE OF THIS PATIENT: 35 minutes.   POSSIBLE D/C IN 1-2 DAYS, DEPENDING ON CLINICAL CONDITION.  Leia Alf Abdulrahman Bracey M.D on 12/06/2017 at 11:11 AM  Between 7am to 6pm - Pager - [phone]  After 6pm go to [email or web address] - password [email or web address] [place] [facility]  Office  [phone]  CC: Primary care physician; Lavera Guise, MD  Note: This dictation was prepared with Dragon dictation along with smaller phrase technology. Any transcriptional errors that result from this process are unintentional.

## 2017-12-06 NOTE — Progress Notes (Addendum)
Initial Nutrition Assessment  DOCUMENTATION CODES:   Severe malnutrition in context of chronic illness  INTERVENTION:   Recommend folic acid 67m daily  Ensure Enlive po TID, each supplement provides 350 kcal and 20 grams of protein  Magic cup TID with meals, each supplement provides 290 kcal and 9 grams of protein  MVI daily  Vital Cuisine TID, each supplement provides 520kcal and 22g of protein.   Snacks  NUTRITION DIAGNOSIS:   Severe Malnutrition related to acute illness as evidenced by 17 percent weight loss in 5 months, severe fat depletion, severe muscle depletion.  GOAL:   Patient will meet greater than or equal to 90% of their needs  MONITOR:   PO intake, Supplement acceptance, Weight trends, Labs, I & O's  REASON FOR ASSESSMENT:   Consult Assessment of nutrition requirement/status  ASSESSMENT:   72y.o. male with a known history of CAD, hypertension, CKD stage III and thyroid disease.  The patient got colonoscopy and polyps removed 2 weeks ago.  He complains of dark stool in the rectal bleeding for the past 5 days.   Met with pt and pt's wife in room today. Pt reports poor appetite and oral intake for the past 4 months. Pt reports that he will intermittently vomit after he eats. Pt reports that he has gotten to where he is afraid to eat in fear of vomiting. There is no mention of gastroparesis in pt's chart. Pt reports that as long as he takes zofran every day, he is able to keep food down. Pt does drink 2-3 Ensures per day at home. Pt s/p EGD and colonoscopy 1/21; noted to have gastritis and polyps. Pt also noted to have a adenoma. Pt presenting this admit for GIB. Patient is following with GI and Dr. YTasia Catchingsat the cancer center. Pt noted to have anemia; pt's folic acid noted to be low on 1/3. Per chart, pt has lost 29lbs(17%) in 5 months; this is severe weight loss. Pt's weight loss seems to be slowing down over the past 1 1/2 months. RD discussed with pt ways to  increase protein and calorie intake in his diet. Handouts were given to patient with tips on how to prepare food. If pt is unable to maintain his weight, recommend PEG tube placement and nutrition support to help pt meet his estimated needs. RD will order supplements and MVI. Pt currently eating 50-75% of soft diet.   Medications reviewed and include: folic acid, synthroid, protonix, NaCl @50ml /hr, zofran  Labs reviewed: creat 1.51(H), Ca 7.8(L) Iron 35(L), TIBC 195(L), ferritin 254 wnl, folate 5.8(L)- 1/3 B12 713 wnl- 1/3 Wbc- 13.8(H), Hgb 9.9(L), Hct 29.5(L)  Nutrition-Focused physical exam completed. Findings are moderate fat depletions in orbitals and buccal regions, severe fat depletions in arms and chest, moderate to severe muscle depletions over entire body, and no edema. Pt noted to have dry, scaly skin which he reports is normal for him his whole life.   Diet Order:  DIET SOFT Room service appropriate? Yes; Fluid consistency: Thin  EDUCATION NEEDS:   Education needs have been addressed  Skin: Reviewed RN Assessment  Last BM:  1/31- type 4  Height:   Ht Readings from Last 1 Encounters:  12/05/17 6' 1"  (1.854 m)    Weight:   Wt Readings from Last 1 Encounters:  12/05/17 145 lb 12.8 oz (66.1 kg)    Ideal Body Weight:  83.6 kg  BMI:  Body mass index is 19.24 kg/m.  Estimated Nutritional Needs:  Kcal:  1900-2200kcal/day   Protein:  99-112g/day   Fluid:  >1.9L/day   Koleen Distance MS, RD, LDN Pager #[phone] After Hours Pager: [phone]

## 2017-12-06 NOTE — Progress Notes (Signed)
Lucilla Lame, MD [facility]   [address] [place], [place] [address] Phone: [phone] Fax : [phone]   Subjective: The patient has had some rectal bleeding today with his bowel movements but not as much as previously.  The patient had dropped his hemoglobin from a baseline of 11.3 down to 9.5 with his repeat this morning was 9.9.  The patient's main concern and the concern of his wife is that he has had a significant amount of weight loss.  The patient had a colonoscopy with multiple polyps removed and may have a post polypectomy patient was supposed to receive a colon prep yesterday for possible colonoscopy today but did not receive the prep.   Objective: Vital signs in last 24 hours: Vitals:   12/06/17 1023 12/06/17 1201 12/06/17 1437 12/06/17 1942  BP: (!) 174/61 (!) 183/70  (!) 143/93  Pulse: (!) 42 (!) 47  (!) 110  Resp: 17 15  20   Temp: 98 F (36.7 C) 98.1 F (36.7 C)    TempSrc: Oral Oral    SpO2: 97% 98%  98%  Weight:   155 lb 10.3 oz (70.6 kg)   Height:       Weight change:   Intake/Output Summary (Last 24 hours) at 12/06/2017 2045 Last data filed at 12/06/2017 7425 Gross per 24 hour  Intake 1660.7 ml  Output 1025 ml  Net 635.7 ml     Exam: Heart:: Regular rate and rhythm, S1S2 present or without murmur or extra heart sounds Lungs: normal and clear to auscultation and percussion Abdomen: soft, nontender, normal bowel sounds   Lab Results: @LABTEST2 @ Micro Results: No results found for this or any previous visit (from the past 240 hour(s)). Studies/Results: Ct Chest W Contrast  Result Date: 12/05/2017 CLINICAL DATA:  Elevated white blood cell count and weight loss EXAM: CT CHEST WITH CONTRAST TECHNIQUE: Multidetector CT imaging of the chest was performed during intravenous contrast administration. CONTRAST:  70mL ISOVUE-300 IOPAMIDOL (ISOVUE-300) INJECTION 61% COMPARISON:  11/28/2017 chest x-ray, CT of the abdomen 10/15/2017, CT of the chest  10/28/2008. FINDINGS: Cardiovascular: The aorta demonstrates atherosclerotic calcification without aneurysmal dilatation or dissection. No significant cardiac enlargement is noted. Coronary calcifications are seen. Pulmonary artery as visualized demonstrates no large central pulmonary embolus. The left vertebral artery arises directly from the aorta. Mediastinum/Nodes: Thoracic inlet is within normal limits. Scattered small mediastinal lymph nodes are noted. No significant hilar or mediastinal adenopathy is seen. The esophagus is within normal limits. Lungs/Pleura: Diffuse emphysematous changes are again identified. Mild scarring is noted in the right lung base posteriorly. No focal infiltrate or sizable effusion is seen. No parenchymal nodules are noted. Upper Abdomen: Left hepatic cyst is noted slightly larger than that seen on the prior exam now measuring 2.1 cm in greatest dimension. Mild nodularity to the liver is noted consistent with underlying cirrhotic change. No focal mass lesion is noted. Spleen is within normal limits. Scattered upper abdominal adenopathy is noted. The largest of these are noted in the portacaval space measuring 16 mm in short axis. Bilateral renal cystic changes are noted. The right kidney is somewhat shrunken similar to that seen on prior exam. Musculoskeletal: Degenerative changes of the thoracic spine are noted. IMPRESSION: Mild increase in abdominal adenopathy when compared with December of 2018. This is most notable in the portacaval space as described. Heterogeneity and nodularity within the liver consistent with underlying cirrhotic change. This is also more marked when compared with the prior exam. CT of the abdomen and  pelvis may be helpful for further evaluation. No acute intrathoracic abnormality is seen. Aortic Atherosclerosis (ICD10-I70.0) and Emphysema (ICD10-J43.9). Electronically Signed   By: Inez Catalina M.D.   On: 12/05/2017 11:51   Medications: I have reviewed the  patient's current medications. Scheduled Meds: . amiodarone  200 mg Oral Daily  . atorvastatin  40 mg Oral QPM  . feeding supplement (ENSURE ENLIVE)  237 mL Oral TID BM  . folic acid  1 mg Oral Daily  . isosorbide mononitrate  30 mg Oral Daily  . levothyroxine  75 mcg Oral QAC breakfast  . lisinopril  5 mg Oral Daily  . multivitamin with minerals  1 tablet Oral Daily  . pantoprazole  40 mg Oral Daily   Continuous Infusions: . sodium chloride 50 mL/hr at 12/06/17 1759   PRN Meds:.acetaminophen **OR** acetaminophen, albuterol, HYDROcodone-acetaminophen, ondansetron **OR** ondansetron (ZOFRAN) IV   Assessment: Active Problems:   Rectal bleeding    Plan: This patient was admitted with lower GI bleed after having a colonoscopy with multiple polyps removed.  The patient has continued to have some rectal bleeding but reported to the last.  The patient is concerned about his continued weight loss.  He denies any diarrhea or any other GI symptoms except nausea which can contribute to his weight loss.  Nothing to explain his bloody diarrhea.  The patient will undergo a colonoscopy tomorrow by Dr. Marius Ditch to look for the source of his GI bleeding.  The patient continues to need a workup for his weight loss and nausea which may need to include a CT scan of the head and possibly chest.  This that he eats a large amount of calories every day but continues to lose weight.   LOS: 0 days   Lucilla Lame 12/06/2017, 8:45 PM

## 2017-12-07 ENCOUNTER — Observation Stay: Payer: Medicare Other | Admitting: Anesthesiology

## 2017-12-07 ENCOUNTER — Observation Stay: Payer: Medicare Other

## 2017-12-07 ENCOUNTER — Encounter
Admission: EM | Disposition: A | Discharge status: Home / Self Care | Payer: Self-pay | Source: Home / Self Care | Attending: Emergency Medicine

## 2017-12-07 ENCOUNTER — Encounter: Payer: Self-pay | Admitting: *Deleted

## 2017-12-07 DIAGNOSIS — R634 Abnormal weight loss: Secondary | ICD-10-CM | POA: Diagnosis not present

## 2017-12-07 DIAGNOSIS — K922 Gastrointestinal hemorrhage, unspecified: Secondary | ICD-10-CM | POA: Diagnosis not present

## 2017-12-07 DIAGNOSIS — I2510 Atherosclerotic heart disease of native coronary artery without angina pectoris: Secondary | ICD-10-CM | POA: Diagnosis not present

## 2017-12-07 DIAGNOSIS — K621 Rectal polyp: Secondary | ICD-10-CM | POA: Diagnosis not present

## 2017-12-07 DIAGNOSIS — I739 Peripheral vascular disease, unspecified: Secondary | ICD-10-CM | POA: Diagnosis not present

## 2017-12-07 DIAGNOSIS — K635 Polyp of colon: Secondary | ICD-10-CM | POA: Diagnosis not present

## 2017-12-07 DIAGNOSIS — D126 Benign neoplasm of colon, unspecified: Secondary | ICD-10-CM | POA: Diagnosis not present

## 2017-12-07 DIAGNOSIS — I10 Essential (primary) hypertension: Secondary | ICD-10-CM | POA: Diagnosis not present

## 2017-12-07 DIAGNOSIS — N183 Chronic kidney disease, stage 3 (moderate): Secondary | ICD-10-CM | POA: Diagnosis not present

## 2017-12-07 DIAGNOSIS — K649 Unspecified hemorrhoids: Secondary | ICD-10-CM | POA: Diagnosis not present

## 2017-12-07 DIAGNOSIS — K625 Hemorrhage of anus and rectum: Secondary | ICD-10-CM | POA: Diagnosis not present

## 2017-12-07 DIAGNOSIS — E43 Unspecified severe protein-calorie malnutrition: Secondary | ICD-10-CM

## 2017-12-07 HISTORY — PX: COLONOSCOPY WITH PROPOFOL: SHX5780

## 2017-12-07 LAB — CBC WITH DIFFERENTIAL/PLATELET
BASOS ABS: 0.1 10*3/uL (ref 0–0.1)
BASOS PCT: 1 %
Eosinophils Absolute: 0.3 10*3/uL (ref 0–0.7)
Eosinophils Relative: 2 %
HEMATOCRIT: 34.1 % — AB (ref 40.0–52.0)
HEMOGLOBIN: 11.3 g/dL — AB (ref 13.0–18.0)
Lymphocytes Relative: 14 %
Lymphs Abs: 2 10*3/uL (ref 1.0–3.6)
MCH: 29.6 pg (ref 26.0–34.0)
MCHC: 33 g/dL (ref 32.0–36.0)
MCV: 89.7 fL (ref 80.0–100.0)
MONO ABS: 1.1 10*3/uL — AB (ref 0.2–1.0)
MONOS PCT: 8 %
NEUTROS ABS: 11.2 10*3/uL — AB (ref 1.4–6.5)
NEUTROS PCT: 77 %
Platelets: 193 10*3/uL (ref 150–440)
RBC: 3.81 MIL/uL — ABNORMAL LOW (ref 4.40–5.90)
RDW: 16 % — AB (ref 11.5–14.5)
WBC: 14.6 10*3/uL — ABNORMAL HIGH (ref 3.8–10.6)

## 2017-12-07 SURGERY — COLONOSCOPY WITH PROPOFOL
Anesthesia: General

## 2017-12-07 MED ORDER — LACTATED RINGERS IV SOLN
INTRAVENOUS | Status: DC | PRN
  Administered 2017-12-07: 13:00:00 via INTRAVENOUS

## 2017-12-07 MED ORDER — PROPOFOL 500 MG/50ML IV EMUL
INTRAVENOUS | Status: DC | PRN
  Administered 2017-12-07: 150 ug/kg/min via INTRAVENOUS

## 2017-12-07 MED ORDER — PROPOFOL 500 MG/50ML IV EMUL
INTRAVENOUS | Status: AC
  Filled 2017-12-07: qty 50

## 2017-12-07 MED ORDER — PROPOFOL 10 MG/ML IV BOLUS
INTRAVENOUS | Status: DC | PRN
  Administered 2017-12-07: 80 mg via INTRAVENOUS

## 2017-12-07 MED ORDER — LIDOCAINE 2% (20 MG/ML) 5 ML SYRINGE
INTRAMUSCULAR | Status: DC | PRN
  Administered 2017-12-07: 100 mg via INTRAVENOUS

## 2017-12-07 NOTE — Anesthesia Postprocedure Evaluation (Deleted)
Anesthesia Post Note  Patient: Alan Bennett  Procedure(s) Performed: COLONOSCOPY WITH PROPOFOL (N/A )  Patient location during evaluation: PACU Anesthesia Type: General Level of consciousness: awake and alert Pain management: pain level controlled Vital Signs Assessment: post-procedure vital signs reviewed and stable Respiratory status: spontaneous breathing, nonlabored ventilation, respiratory function stable and patient connected to nasal cannula oxygen Cardiovascular status: blood pressure returned to baseline and stable Postop Assessment: no apparent nausea or vomiting Anesthetic complications: no     Last Vitals:  Vitals:   12/07/17 1417 12/07/17 1454  BP: (!) 146/101 135/82  Pulse: (!) 110 82  Resp:    Temp: (!) 36.4 C   SpO2: 96%     Last Pain:  Vitals:   12/07/17 1417  TempSrc: Oral  PainSc:                  Martha Clan

## 2017-12-07 NOTE — Progress Notes (Signed)
[facility] [place] NAME: Alan Bennett    MR#:  [id number]  DATE OF BIRTH:  10/27/1946  SUBJECTIVE:  CHIEF COMPLAINT:   Chief Complaint  Patient presents with  . Rectal Bleeding  Patient does not meet inpatient criteria per EMR system, for colonoscopy by gastroenterology in the near future, the patient and the patient's wife concern for continued weight loss/persistent nausea, per gastroenterology would like CT of the chest/CT of the head for further evaluation-ordered  REVIEW OF SYSTEMS:  CONSTITUTIONAL: No fever, fatigue or weakness.  EYES: No blurred or double vision.  EARS, NOSE, AND THROAT: No tinnitus or ear pain.  RESPIRATORY: No cough, shortness of breath, wheezing or hemoptysis.  CARDIOVASCULAR: No chest pain, orthopnea, edema.  GASTROINTESTINAL: No nausea, vomiting, diarrhea or abdominal pain.  GENITOURINARY: No dysuria, hematuria.  ENDOCRINE: No polyuria, nocturia,  HEMATOLOGY: No anemia, easy bruising or bleeding SKIN: No rash or lesion. MUSCULOSKELETAL: No joint pain or arthritis.   NEUROLOGIC: No tingling, numbness, weakness.  PSYCHIATRY: No anxiety or depression.   ROS  DRUG ALLERGIES:  Not on File  VITALS:  Blood pressure (!) 164/96, pulse (!) 110, temperature 98.1 F (36.7 C), temperature source Oral, resp. rate 20, height 6\' 1"  (1.854 m), weight 70.6 kg (155 lb 10.3 oz), SpO2 98 %.  PHYSICAL EXAMINATION:  GENERAL:  72 y.o.-year-old patient lying in the bed with no acute distress.  EYES: Pupils equal, round, reactive to light and accommodation. No scleral icterus. Extraocular muscles intact.  HEENT: Head atraumatic, normocephalic. Oropharynx and nasopharynx clear.  NECK:  Supple, no jugular venous distention. No thyroid enlargement, no tenderness.  LUNGS: Normal breath sounds bilaterally, no wheezing, rales,rhonchi or crepitation. No use of accessory muscles of respiration.  CARDIOVASCULAR: S1, S2 normal. No  murmurs, rubs, or gallops.  ABDOMEN: Soft, nontender, nondistended. Bowel sounds present. No organomegaly or mass.  EXTREMITIES: No pedal edema, cyanosis, or clubbing.  NEUROLOGIC: Cranial nerves II through XII are intact. Muscle strength 5/5 in all extremities. Sensation intact. Gait not checked.  PSYCHIATRIC: The patient is alert and oriented x 3.  SKIN: No obvious rash, lesion, or ulcer.   Physical Exam LABORATORY PANEL:   CBC Recent Labs  Lab 12/07/17 0557  WBC 14.6*  HGB 11.3*  HCT 34.1*  PLT 193   ------------------------------------------------------------------------------------------------------------------  Chemistries  Recent Labs  Lab 12/05/17 1027 12/06/17 0519  NA 140 142  K 4.3 4.3  CL 109 112*  CO2 21* 23  GLUCOSE 120* 85  BUN 26* 18  CREATININE 1.75* 1.51*  CALCIUM 8.0* 7.8*  AST 113*  --   ALT 53  --   ALKPHOS 234*  --   BILITOT 0.8  --    ------------------------------------------------------------------------------------------------------------------  Cardiac Enzymes No results for input(s): TROPONINI in the last 168 hours. ------------------------------------------------------------------------------------------------------------------  RADIOLOGY:  Ct Head Wo Contrast  Result Date: 12/07/2017 CLINICAL DATA:  Unintended weight loss. EXAM: CT HEAD WITHOUT CONTRAST TECHNIQUE: Contiguous axial images were obtained from the base of the skull through the vertex without intravenous contrast. COMPARISON:  None. FINDINGS: Brain: No subdural, epidural, or subarachnoid hemorrhage. Cerebellum, brainstem, and basal cisterns are normal. Ventricles and sulci are mildly prominent, likely due to mild atrophy. No acute cortical ischemia or infarct. No mass effect or midline shift. Vascular: No hyperdense vessel or unexpected calcification. Skull: Normal. Negative for fracture or focal lesion. Sinuses/Orbits: No acute finding. Other: None. IMPRESSION: No acute  intracranial abnormality to explain the patient's weight loss. Electronically  Signed   By: Dorise Bullion III M.D   On: 12/07/2017 10:22    ASSESSMENT AND PLAN:  * Lower GI bleed Appears stable Gastroenterology input appreciated, for colonoscopy in the near future  * Acute blood loss anemia Stable  CBC daily and transfuse as needed  * CKD3  Stable  * HTN Continue home medications  * Wt loss Secondary to unknown etiology  Gastroenterology input appreciated-CT chest noted for cirrhotic changes to the liver/recommended CT head for further evaluation-currently pending  Followed by GI and Dr. Tasia Catchings at the cancer center.   All the records are reviewed and case discussed with Care Management/Social Workerr. Management plans discussed with the patient, family and they are in agreement.  CODE STATUS: full  TOTAL TIME TAKING CARE OF THIS PATIENT: 40 minutes.     POSSIBLE D/C IN 1-3 DAYS, DEPENDING ON CLINICAL CONDITION.   Avel Peace Salary M.D on 12/07/2017   Between 7am to 6pm - Pager - [phone]  After 6pm go to [email or web address] - password [email or web address] [facility]  Office  [phone]  CC: Primary care physician; Lavera Guise, MD  Note: This dictation was prepared with Dragon dictation along with smaller phrase technology. Any transcriptional errors that result from this process are unintentional.

## 2017-12-07 NOTE — Progress Notes (Signed)
15 minute call to floor. 

## 2017-12-07 NOTE — Progress Notes (Signed)
Discharge teaching given to patient, patient verbalized understanding and had no questions. Patient IV removed. Patient will be transported home by family. All patient belongings gathered prior to leaving.  

## 2017-12-07 NOTE — Op Note (Signed)
[facility] Gastroenterology Patient Name: Alan Bennett Procedure Date: 12/07/2017 12:34 PM MRN: [id number] Account #: [id number] Date of Birth: [birth year] Admit Type: Inpatient Age: 72 Room: [facility] ENDO ROOM 4 Gender: Male Note Status: Finalized Procedure:            Colonoscopy Indications:          Rectal bleeding, last colonoscopy with polypoectomy on                        11/25/17, r/o post polypectomy bleeding Providers:            Lin Landsman MD, MD Referring MD:         Lavera Guise, MD (Referring MD) Medicines:            Monitored Anesthesia Care Complications:        No immediate complications. Estimated blood loss: None. Procedure:            Pre-Anesthesia Assessment:                       - Prior to the procedure, a History and Physical was                        performed, and patient medications and allergies were                        reviewed. The patient is competent. The risks and                        benefits of the procedure and the sedation options and                        risks were discussed with the patient. All questions                        were answered and informed consent was obtained.                        Patient identification and proposed procedure were                        verified by the physician, the nurse, the                        anesthesiologist, the anesthetist and the technician in                        the pre-procedure area in the procedure room. Mental                        Status Examination: alert and oriented. Airway                        Examination: normal oropharyngeal airway and neck                        mobility. Respiratory Examination: clear to                        auscultation. CV Examination: normal. Prophylactic  Antibiotics: The patient does not require prophylactic                        antibiotics. Prior Anticoagulants: The patient has    taken no previous anticoagulant or antiplatelet agents.                        ASA Grade Assessment: III - A patient with severe                        systemic disease. After reviewing the risks and                        benefits, the patient was deemed in satisfactory                        condition to undergo the procedure. The anesthesia plan                        was to use monitored anesthesia care (MAC). Immediately                        prior to administration of medications, the patient was                        re-assessed for adequacy to receive sedatives. The                        heart rate, respiratory rate, oxygen saturations, blood                        pressure, adequacy of pulmonary ventilation, and                        response to care were monitored throughout the                        procedure. The physical status of the patient was                        re-assessed after the procedure.                       After obtaining informed consent, the colonoscope was                        passed under direct vision. Throughout the procedure,                        the patient's blood pressure, pulse, and oxygen                        saturations were monitored continuously. The                        Colonoscope was introduced through the anus and                        advanced to the the cecum, identified by appendiceal  orifice and ileocecal valve. The colonoscopy was                        performed without difficulty. The patient tolerated the                        procedure well. The quality of the bowel preparation                        was poor. Findings:      The perianal and digital rectal examinations were normal. Pertinent       negatives include normal sphincter tone and no palpable rectal lesions.      Liquid brown stool in entire colon      Two sessile polyps were found in the ascending colon. The polyps were 6       to 7  mm in size. These polyps were removed with a hot snare. Resection       and retrieval were complete.      Previous polypectomy site in transverse colon identified with 2 clips,       normal. Active bleeding or stigmata of recent bleeding are not seen.      A 5 mm polyp was found in the transverse colon. The polyp was sessile.       The polyp was removed with a cold snare. Resection and retrieval were       complete.      A 5 mm polyp was found in the descending colon. The polyp was sessile.       The polyp was removed with a cold snare. Resection and retrieval were       complete.      A 5 mm polyp was found in the rectum. The polyp was sessile. The polyp       was removed with a cold snare. Resection and retrieval were complete.      A moderate amount of stool was found in the entire colon, interfering       with visualization. Lavage of the area was performed using a moderate       amount of normal saline, resulting in clearance with fair visualization.      Non-bleeding external hemorrhoids were found during retroflexion. The       hemorrhoids were medium-sized. Impression:           - Preparation of the colon was poor.                       - Two 6 to 7 mm polyps in the ascending colon, removed                        with a hot snare. Resected and retrieved.                       - One 5 mm polyp in the transverse colon, removed with                        a cold snare. Resected and retrieved.                       - One 5 mm polyp in the descending colon, removed with  a cold snare. Resected and retrieved.                       - One 5 mm polyp in the rectum, removed with a cold                        snare. Resected and retrieved.                       - Stool in the entire examined colon.                       - Non-bleeding external hemorrhoids. Recommendation:       - Await pathology results.                       - Repeat colonoscopy in 1 year for  surveillance.                       - Return patient to hospital ward for possible                        discharge same day.                       - Resume previous diet today.                       - Continue present medications.                       - Follow up with Dr Vicente Males in clinic in 2weeks Procedure Code(s):    --- Professional ---                       [phone], Colonoscopy, flexible; with removal of tumor(s),                        polyp(s), or other lesion(s) by snare technique Diagnosis Code(s):    --- Professional ---                       K64.4, Residual hemorrhoidal skin tags                       D12.2, Benign neoplasm of ascending colon                       D12.3, Benign neoplasm of transverse colon (hepatic                        flexure or splenic flexure)                       D12.4, Benign neoplasm of descending colon                       K62.1, Rectal polyp                       K62.5, Hemorrhage of anus and rectum CPT copyright 2016 American Medical Association. All rights reserved. The codes documented in this report are preliminary and upon coder review may  be revised to meet current  compliance requirements. Dr. Ulyess Mort Lin Landsman MD, MD 12/07/2017 1:26:13 PM This report has been signed electronically. Number of Addenda: 0 Note Initiated On: 12/07/2017 12:34 PM Scope Withdrawal Time: 0 hours 21 minutes 22 seconds  Total Procedure Duration: 0 hours 25 minutes 5 seconds       [facility]

## 2017-12-07 NOTE — Anesthesia Post-op Follow-up Note (Signed)
Anesthesia QCDR form completed.        

## 2017-12-07 NOTE — Anesthesia Preprocedure Evaluation (Addendum)
Anesthesia Evaluation  Patient identified by MRN, date of birth, ID band Patient awake    Reviewed: Allergy & Precautions, NPO status , Patient's Chart, lab work & pertinent test results  History of Anesthesia Complications Negative for: history of anesthetic complications  Airway Mallampati: III  TM Distance: >3 FB Neck ROM: Full    Dental  (+) Upper Dentures, Lower Dentures   Pulmonary neg sleep apnea, neg COPD, former smoker,    breath sounds clear to auscultation- rhonchi (-) wheezing      Cardiovascular hypertension, Pt. on medications + CAD (nonocclusive) and + Peripheral Vascular Disease  (-) Past MI, (-) Cardiac Stents and (-) CABG  Rhythm:Regular Rate:Normal - Systolic murmurs and - Diastolic murmurs    Neuro/Psych negative neurological ROS  negative psych ROS   GI/Hepatic negative GI ROS, Neg liver ROS,   Endo/Other  negative endocrine ROSneg diabetes  Renal/GU Renal InsufficiencyRenal disease     Musculoskeletal  (+) Arthritis ,   Abdominal (+) - obese,   Peds  Hematology  (+) anemia ,   Anesthesia Other Findings Past Medical History: No date: Coronary artery disease No date: Hypertension No date: Renal disorder No date: Thyroid disease   Reproductive/Obstetrics                             Anesthesia Physical  Anesthesia Plan  ASA: III  Anesthesia Plan: General   Post-op Pain Management:    Induction: Intravenous  PONV Risk Score and Plan: 1 and Propofol infusion  Airway Management Planned: Natural Airway  Additional Equipment:   Intra-op Plan:   Post-operative Plan:   Informed Consent: I have reviewed the patients History and Physical, chart, labs and discussed the procedure including the risks, benefits and alternatives for the proposed anesthesia with the patient or authorized representative who has indicated his/her understanding and acceptance.    Dental advisory given  Plan Discussed with: CRNA and Anesthesiologist  Anesthesia Plan Comments:         Anesthesia Quick Evaluation

## 2017-12-07 NOTE — Transfer of Care (Signed)
Immediate Anesthesia Transfer of Care Note  Patient: Alan Bennett  Procedure(s) Performed: COLONOSCOPY WITH PROPOFOL (N/A )  Patient Location: PACU  Anesthesia Type:General  Level of Consciousness: awake, alert  and oriented  Airway & Oxygen Therapy: Patient connected to nasal cannula oxygen  Post-op Assessment: Post -op Vital signs reviewed and stable  Post vital signs: stable  Last Vitals:  Vitals:   12/07/17 1108 12/07/17 1329  BP: (!) 164/96 110/67  Pulse:  97  Resp:  14  Temp:    SpO2:  99%    Last Pain:  Vitals:   12/07/17 0950  TempSrc:   PainSc: 0-No pain         Complications: No apparent anesthesia complications

## 2017-12-07 NOTE — Progress Notes (Addendum)
Colonoscopy post procedure note:  Findings: - Preparation of the colon was poor. - Two 6 to 7 mm polyps in the ascending colon, removed with a hot snare. Resected and retrieved. - One 5 mm polyp in the transverse colon, removed with a cold snare. Resected and retrieved. - One 5 mm polyp in the descending colon, removed with a cold snare. Resected and retrieved. - One 5 mm polyp in the rectum, removed with a cold snare. Resected and retrieved. - Stool in the entire examined colon. - Non-bleeding external hemorrhoids.  Recs: - Await pathology results - Repeat colonoscopy in 1 year for surveillance. - Return patient to hospital ward for possible discharge same day. - Resume previous diet today. - Continue present medications. 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 He will need EGD for polypectomy - Per pt's wife, pt was not on anticoagulation due to anemia and leukocytosis pending work up by GI and oncology - Ok to start anticoagulation as out pt from GI standpoint. Will defer to his out pt cardiology about further recs - Avoid NSAIDs - Resume diet  GI will sign off. Please call us back with questions  Cephas Darby, MD [address]  [place]  [place], [place] [address]  Main: [phone]  Fax: [phone] Pager: [phone]

## 2017-12-07 NOTE — Progress Notes (Signed)
Notified Dr. Jerelyn Charles of patients blood pressure of 141/101 and HR-111 post colonoscopy, per Dr. Jerelyn Charles he is ok with the blood pressure. Will continue to monitor patient.

## 2017-12-07 NOTE — Anesthesia Postprocedure Evaluation (Signed)
Anesthesia Post Note  Patient: Alan Bennett  Procedure(s) Performed: COLONOSCOPY WITH PROPOFOL (N/A )  Patient location during evaluation: Endoscopy Anesthesia Type: General Level of consciousness: awake and alert Pain management: pain level controlled Vital Signs Assessment: post-procedure vital signs reviewed and stable Respiratory status: spontaneous breathing, nonlabored ventilation, respiratory function stable and patient connected to nasal cannula oxygen Cardiovascular status: blood pressure returned to baseline and stable Postop Assessment: no apparent nausea or vomiting Anesthetic complications: no     Last Vitals:  Vitals:   12/07/17 1417 12/07/17 1454  BP: (!) 146/101 135/82  Pulse: (!) 110 82  Resp:    Temp: (!) 36.4 C   SpO2: 96%     Last Pain:  Vitals:   12/07/17 1417  TempSrc: Oral  PainSc:                  Martha Clan

## 2017-12-08 NOTE — Discharge Summary (Signed)
[facility] NAME: Alan Bennett    MR#:  [id number]  DATE OF BIRTH:  1946/08/07  DATE OF ADMISSION:  12/05/2017 ADMITTING PHYSICIAN: Demetrios Loll, MD  DATE OF DISCHARGE: 12/07/2017  5:39 PM  PRIMARY CARE PHYSICIAN: Lavera Guise, MD    ADMISSION DIAGNOSIS:  Acute GI bleeding [K92.2]  DISCHARGE DIAGNOSIS:  Active Problems:   Rectal bleeding   Acute GI bleeding   Protein-calorie malnutrition, severe   SECONDARY DIAGNOSIS:   Past Medical History:  Diagnosis Date  . Coronary artery disease   . Hypertension   . Renal disorder   . Thyroid disease     HOSPITAL COURSE:  *Lower GI bleed Resolved Gastroenterology did see patient while in house, colonoscopy:  Findings: - Preparation of the colon was poor. - Two 6 to 7 mm polyps in the ascending colon, removed with a hot snare. Resected and retrieved. - One 5 mm polyp in the transverse colon, removed with a cold snare. Resected and retrieved. - One 5 mm polyp in the descending colon, removed with a cold snare. Resected and retrieved. - One 5 mm polyp in the rectum, removed with a cold snare. Resected and retrieved. - Stool in the entire examined colon. - Non-bleeding external hemorrhoids.  Recs: - Await pathology results - Repeat colonoscopy in 1 year for surveillance. - Return patient to hospital ward for possible discharge same day. - Resume previous diet today. - Continue present medications. 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 He will need EGD for polypectomy - Per pt's wife, pt was not on anticoagulation due to anemia and leukocytosis pending work up by GI and oncology - Ok to start anticoagulation as out pt from GI standpoint. Will defer to his out pt cardiology about further recs - Avoid NSAIDs - Resumed diet  * Acute blood loss anemia Resolved  * CKD3  Stable Avoid nephrotoxic agents  * HTN Continued  home medications  * Wt loss Secondary to unknown etiology  Advised to followed by GI and Dr.Yuat the cancer center.    DISCHARGE CONDITIONS:  On day of discharge patient is afebrile, hemodynamically stable, ready for discharge home with appropriate follow-up with primary care provider in 3-5 days for reevaluation, for more specific details please see chart  CONSULTS OBTAINED:  Treatment Team:  Jonathon Bellows, MD Lucilla Lame, MD Herrick Hartog, Avel Peace, MD  DRUG ALLERGIES:  No Known Allergies  DISCHARGE MEDICATIONS:   Allergies as of 12/07/2017   No Known Allergies     Medication List    TAKE these medications   amiodarone 200 MG tablet Commonly known as:  PACERONE Take 200 mg by mouth daily.   atorvastatin 40 MG tablet Commonly known as:  LIPITOR Take 40 mg by mouth every evening.   Fish Oil 1000 MG Caps Take 1,000 mg by mouth daily.   folic acid 1 MG tablet Commonly known as:  FOLVITE Take 1 tablet (1 mg total) by mouth daily.   isosorbide mononitrate 30 MG 24 hr tablet Commonly known as:  IMDUR Take 30 mg by mouth daily.   levothyroxine 75 MCG tablet Commonly known as:  SYNTHROID, LEVOTHROID Take 75 mcg by mouth daily.   lisinopril 5 MG tablet Commonly known as:  PRINIVIL,ZESTRIL Take 5 mg by mouth daily.   omeprazole 40 MG capsule Commonly known as:  PRILOSEC Take 1 capsule (40 mg total) by mouth  daily.   ondansetron 4 MG tablet Commonly known as:  ZOFRAN Take 1 tablet (4 mg total) by mouth every 12 (twelve) hours as needed for nausea or vomiting.        DISCHARGE INSTRUCTIONS:    If you experience worsening of your admission symptoms, develop shortness of breath, life threatening emergency, suicidal or homicidal thoughts you must seek medical attention immediately by calling 911 or calling your MD immediately  if symptoms less severe.  You Must read complete instructions/literature along with all the possible adverse reactions/side effects for all  the Medicines you take and that have been prescribed to you. Take any new Medicines after you have completely understood and accept all the possible adverse reactions/side effects.   Please note  You were cared for by a hospitalist during your hospital stay. If you have any questions about your discharge medications or the care you received while you were in the hospital after you are discharged, you can call the unit and asked to speak with the hospitalist on call if the hospitalist that took care of you is not available. Once you are discharged, your primary care physician will handle any further medical issues. Please note that NO REFILLS for any discharge medications will be authorized once you are discharged, as it is imperative that you return to your primary care physician (or establish a relationship with a primary care physician if you do not have one) for your aftercare needs so that they can reassess your need for medications and monitor your lab values.    Today   CHIEF COMPLAINT:   Chief Complaint  Patient presents with  . Rectal Bleeding    HISTORY OF PRESENT ILLNESS:  72 y.o. male with a known history of CAD, hypertension, CKD stage III and thyroid disease.  The patient got colonoscopy and polyps removed 2 weeks ago.  He complains of dark stool in the rectal bleeding for the past 5 days.  He denies any abdominal pain, nausea, vomiting or diarrhea.  But he lost weight for the past 4 months.  Dr. Vicente Males, GI physician requested admission and get a CAT scan of the chest.  VITAL SIGNS:  Blood pressure 135/82, pulse 82, temperature (!) 97.5 F (36.4 C), temperature source Oral, resp. rate 19, height 6\' 1"  (1.854 m), weight 70.6 kg (155 lb 10.3 oz), SpO2 96 %.  I/O:    Intake/Output Summary (Last 24 hours) at 12/08/2017 0735 Last data filed at 12/07/2017 1200 Gross per 24 hour  Intake -  Output 0 ml  Net 0 ml    PHYSICAL EXAMINATION:  GENERAL:  72 y.o.-year-old patient lying in  the bed with no acute distress.  EYES: Pupils equal, round, reactive to light and accommodation. No scleral icterus. Extraocular muscles intact.  HEENT: Head atraumatic, normocephalic. Oropharynx and nasopharynx clear.  NECK:  Supple, no jugular venous distention. No thyroid enlargement, no tenderness.  LUNGS: Normal breath sounds bilaterally, no wheezing, rales,rhonchi or crepitation. No use of accessory muscles of respiration.  CARDIOVASCULAR: S1, S2 normal. No murmurs, rubs, or gallops.  ABDOMEN: Soft, non-tender, non-distended. Bowel sounds present. No organomegaly or mass.  EXTREMITIES: No pedal edema, cyanosis, or clubbing.  NEUROLOGIC: Cranial nerves II through XII are intact. Muscle strength 5/5 in all extremities. Sensation intact. Gait not checked.  PSYCHIATRIC: The patient is alert and oriented x 3.  SKIN: No obvious rash, lesion, or ulcer.   DATA REVIEW:   CBC Recent Labs  Lab 12/07/17 0557  WBC 14.6*  HGB 11.3*  HCT 34.1*  PLT 193    Chemistries  Recent Labs  Lab 12/05/17 1027 12/06/17 0519  NA 140 142  K 4.3 4.3  CL 109 112*  CO2 21* 23  GLUCOSE 120* 85  BUN 26* 18  CREATININE 1.75* 1.51*  CALCIUM 8.0* 7.8*  AST 113*  --   ALT 53  --   ALKPHOS 234*  --   BILITOT 0.8  --     Cardiac Enzymes No results for input(s): TROPONINI in the last 168 hours.  Microbiology Results  No results found for this or any previous visit.  RADIOLOGY:  Ct Head Wo Contrast  Result Date: 12/07/2017 CLINICAL DATA:  Unintended weight loss. EXAM: CT HEAD WITHOUT CONTRAST TECHNIQUE: Contiguous axial images were obtained from the base of the skull through the vertex without intravenous contrast. COMPARISON:  None. FINDINGS: Brain: No subdural, epidural, or subarachnoid hemorrhage. Cerebellum, brainstem, and basal cisterns are normal. Ventricles and sulci are mildly prominent, likely due to mild atrophy. No acute cortical ischemia or infarct. No mass effect or midline shift.  Vascular: No hyperdense vessel or unexpected calcification. Skull: Normal. Negative for fracture or focal lesion. Sinuses/Orbits: No acute finding. Other: None. IMPRESSION: No acute intracranial abnormality to explain the patient's weight loss. Electronically Signed   By: Dorise Bullion III M.D   On: 12/07/2017 10:22    EKG:  No orders found for this or any previous visit.    Management plans discussed with the patient, family and they are in agreement.  CODE STATUS:  Code Status History    Date Active Date Inactive Code Status Order ID Comments User Context   12/05/2017 14:15 12/07/2017 20:44 Full Code [id number]  Demetrios Loll, MD Inpatient      TOTAL TIME TAKING CARE OF THIS PATIENT: 45 minutes.    Avel Peace Saharsh Sterling M.D on 12/08/2017 at 7:35 AM  Between 7am to 6pm - Pager - [phone]  After 6pm go to [email or web address] - password [email or web address] [facility]  Office  [phone]  CC: Primary care physician; Lavera Guise, MD   Note: This dictation was prepared with Dragon dictation along with smaller phrase technology. Any transcriptional errors that result from this process are unintentional.

## 2017-12-09 ENCOUNTER — Encounter: Payer: Self-pay | Admitting: Gastroenterology

## 2017-12-10 LAB — SURGICAL PATHOLOGY

## 2017-12-11 NOTE — Progress Notes (Signed)
Hematology/Oncology Follow up note [facility] Telephone:([phone]) [phone] [phone]   Patient Care Team: Lavera Guise, MD as PCP - General (Internal Medicine)  REFERRING PROVIDER: Merlyn Lot CHIEF COMPLAINTS/PURPOSE OF CONSULTATION:  Leukocytosis  HISTORY OF PRESENTING ILLNESS:  Alan Bennett is a  72 y.o.  male with PMH listed below who was referred to me for evaluation of leukocytosis.  Patient presented to ER for evaluation of 3 months weight loss, nausea and vomiting. He had blood work done which showed leukocytosis, predominantly neutrophilia and monocytosis.   Patient reports 4-6 weeks of feeling food stuck in the throat which triggers vomiting. He also has had a 15 pound weight loss over the 3 months. Denies any cough, shortness of breath. He is a former smoker, quit about 10 years ago. Denies any alcohol use.   # He has history of atrial fibrillation.# He has atrial fibrillation and is supposed to be on Eliquis for anticoagulation.  He has not started on taking it because [facility] see if [facility] can be [facility].  He will have a follow-up appointment with his cardiologist and decide.  Currently not on Eliquis.  # 11/25/2017 He had EGD/Colonoscopy  Findings includes nonbleeding internal hemorrhoids, multiple polyps in the colon which were removed and resected.  EGD findings include mild Schatzki ring, gastritis and a single duodenal polyp which was biopsied. Pathology revealed duodenal tubular adenoma, negative for high-grade dysplasia and malignancy. Stomach biopsy negative for active inflammation dyspepsia and malignancy.  Negative for H. Pylori. Multiple tubular adenoma throughout cecum, transverse colon and descending colon.  Negative for high-grade dysplasia and malignancy. Rectum hyperplastic polyp negative for dysplasia and malignancy.  Small fragment of tubular adenoma.  # Patient was started on folic acid  supplementation for treatment of folate deficiency.  INTERVAL HISTORY Alan Bennett is a 72 y.o. male who has above history reviewed by me today presents for follow up visit for management of leukocytosis, weight loss, nausea.   During the interval, 12/05/2017, patient presented to ER due to dark stool in the rectal bleeding for the past 5 days.  Hemoglobin dropped, considered to be secondary to lower GI bleed from polypectomy.Patient was evaluated by GI, and he had a repeat colonoscopy which demonstrated a few more polyps in the colon.  Polyps were resected and retrieved.  The pathology showed tubular adenoma/hyperplastic polyps, negative for high-grade dysplasia and malignancy. Patient is to need to follow-up with Dr. Vicente Males for duodenal adenoma removal..  #Patient also has had a chest CT chest done during admission.  There is no acute findings in the thorax.  Mild increase of abdominal adenopathy compared to scan that was done in 2018 December, most notable in the portal caval space.  Patient reports that he has felt slightly better, appetite is fair.  He has gained 3 pounds since November 14, 2017.    Review of Systems  Constitutional: Positive for malaise/fatigue. Negative for chills, fever and weight loss.  HENT: Negative for hearing loss and nosebleeds.   Eyes: Negative for blurred vision and pain.  Respiratory: Negative for cough, sputum production and shortness of breath.   Cardiovascular: Negative for chest pain, orthopnea and claudication.  Gastrointestinal: Negative for abdominal pain, blood in stool, heartburn, nausea and vomiting.  Genitourinary: Negative for dysuria and frequency.  Musculoskeletal: Negative for myalgias and neck pain.  Skin: Negative for rash.  Neurological: Negative for dizziness, tremors and weakness.  Endo/Heme/Allergies: Does not bruise/bleed easily.  Psychiatric/Behavioral: Negative for depression. The patient is not nervous/anxious.     MEDICAL  HISTORY:  Past Medical History:  Diagnosis Date  . Coronary artery disease   . Hypertension   . Renal disorder   . Thyroid disease     SURGICAL HISTORY: Past Surgical History:  Procedure Laterality Date  . COLONOSCOPY WITH PROPOFOL N/A 11/25/2017   Procedure: COLONOSCOPY WITH PROPOFOL;  Surgeon: Jonathon Bellows, MD;  Location: [facility] ENDOSCOPY;  Service: Gastroenterology;  Laterality: N/A;  . COLONOSCOPY WITH PROPOFOL N/A 12/07/2017   Procedure: COLONOSCOPY WITH PROPOFOL;  Surgeon: Lin Landsman, MD;  Location: [facility] ENDOSCOPY;  Service: Gastroenterology;  Laterality: N/A;  . ESOPHAGOGASTRODUODENOSCOPY (EGD) WITH PROPOFOL N/A 11/25/2017   Procedure: ESOPHAGOGASTRODUODENOSCOPY (EGD) WITH PROPOFOL;  Surgeon: Jonathon Bellows, MD;  Location: [facility] ENDOSCOPY;  Service: Gastroenterology;  Laterality: N/A;    SOCIAL HISTORY: Social History   Socioeconomic History  . Marital status: Married    Spouse name: Not on file  . Number of children: Not on file  . Years of education: Not on file  . Highest education level: Not on file  Social Needs  . Financial resource strain: Not on file  . Food insecurity - worry: Not on file  . Food insecurity - inability: Not on file  . Transportation needs - medical: Not on file  . Transportation needs - non-medical: Not on file  Occupational History  . Not on file  Tobacco Use  . Smoking status: Former Smoker    Years: 20.00  . Smokeless tobacco: Never Used  . Tobacco comment: 10 years ago  Substance and Sexual Activity  . Alcohol use: No    Frequency: Never  . Drug use: No  . Sexual activity: Yes  Other Topics Concern  . Not on file  Social History Narrative  . Not on file    FAMILY HISTORY: Family History  Problem Relation Age of Onset  . Pneumonia Father   . Heart attack Brother     ALLERGIES:  has No Known Allergies.  MEDICATIONS:  Current Outpatient Medications  Medication Sig Dispense Refill  . amiodarone (PACERONE) 200 MG tablet  Take 200 mg by mouth daily.     Marland Kitchen atorvastatin (LIPITOR) 40 MG tablet Take 40 mg by mouth every evening.     . folic acid (FOLVITE) 1 MG tablet Take 1 tablet (1 mg total) by mouth daily. 30 tablet 1  . isosorbide mononitrate (IMDUR) 30 MG 24 hr tablet Take 30 mg by mouth daily.     Marland Kitchen levothyroxine (SYNTHROID, LEVOTHROID) 75 MCG tablet Take 75 mcg by mouth daily.     Marland Kitchen lisinopril (PRINIVIL,ZESTRIL) 5 MG tablet Take 5 mg by mouth daily.     . Omega-3 Fatty Acids (FISH OIL) 1000 MG CAPS Take 1,000 mg by mouth daily.    Marland Kitchen omeprazole (PRILOSEC) 40 MG capsule Take 1 capsule (40 mg total) by mouth daily. 30 capsule 1  . ondansetron (ZOFRAN) 4 MG tablet Take 1 tablet (4 mg total) by mouth every 12 (twelve) hours as needed for nausea or vomiting. 60 tablet 0   No current facility-administered medications for this visit.      PHYSICAL EXAMINATION: ECOG PERFORMANCE STATUS: 1 - Symptomatic but completely ambulatory Vitals:   12/12/17 1010 12/12/17 1019  BP: (!) 177/77 (!) 159/78  Pulse: (!) 53 (!) 49  Resp: 12   Temp: 97.9 F (36.6 C)    Filed Weights   12/12/17 1010  Weight: 154 lb 1.6 oz (69.9  kg)    Physical Exam  Constitutional: He is oriented to person, place, and time. No distress.  Thin.   HENT:  Head: Normocephalic and atraumatic.  Mouth/Throat: Oropharynx is clear and moist. No oropharyngeal exudate.  Eyes: EOM are normal. Pupils are equal, round, and reactive to light. Right eye exhibits no discharge. No scleral icterus.  Neck: Normal range of motion. Neck supple. No JVD present.  Cardiovascular: Normal heart sounds.  No murmur heard. Irregular heart beats, bradycardia.   Pulmonary/Chest: Effort normal and breath sounds normal. No respiratory distress. He has no wheezes. He has no rales.  Abdominal: Soft. Bowel sounds are normal. He exhibits no distension. There is no tenderness. There is no rebound.  Musculoskeletal: Normal range of motion. He exhibits no edema or tenderness.   Lymphadenopathy:    He has no cervical adenopathy.  Neurological: He is alert and oriented to person, place, and time. He displays normal reflexes. No cranial nerve deficit.  Skin: Skin is warm and dry. No erythema.  Psychiatric: Affect normal.     LABORATORY DATA:  I have reviewed the data as listed Lab Results  Component Value Date   WBC 15.2 (H) 12/12/2017   HGB 11.1 (L) 12/12/2017   HCT 33.2 (L) 12/12/2017   MCV 89.9 12/12/2017   PLT 216 12/12/2017   Recent Labs    10/15/17 1628 10/24/17 0919 12/05/17 1027 12/06/17 0519 12/12/17 0948  NA 139 139 140 142 140  K 4.5 4.2 4.3 4.3 4.4  CL 103 103 109 112* 109  CO2 25 28 21* 23 26  GLUCOSE 151* 96 120* 85 136*  BUN 26* 21* 26* 18 22*  CREATININE 2.58* 2.07* 1.75* 1.51* 1.70*  CALCIUM 8.5* 7.5* 8.0* 7.8* 7.6*  GFRNONAA 23* 30* 37* 44* 38*  GFRAA 27* 35* 43* 51* 45*  PROT 7.7 6.5 6.2*  --   --   ALBUMIN 3.2* 2.8* 2.5*  --   --   AST 111* 76* 113*  --   --   ALT 67* 49 53  --   --   ALKPHOS 149* 138* 234*  --   --   BILITOT 0.9 1.0 0.8  --   --    Negative hepatitis panel LDH 157 Negative flowcytometry Negative BCR ABL UA showed proteinuria #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     ASSESSMENT & PLAN:  1. Leukocytosis, unspecified type   2. Neutrophilia   3. Weight loss   4. Folate deficiency   5. MGUS (monoclonal gammopathy of unknown significance)    Leukocytosis predominantly neutrophilia and monocytosis, has trended down, fluctuate around 14-15.  Clinically patient has experienced improving of his symptoms, better appetite and weight gain. CT of chest and abdomen essentially negative except slightly increase of abdominal lymphadenopathy, reactive EBV titers was checked and it was negative for acute infection.  But he may have passed acute setting already. #Acute anemia secondary to blood loss resolved. #Folate deficiency resolved after folate supplementation. 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  . #SPEP showed faint IgA monoclonal band, 0.3g/dL, Normal light chain ratio. Urine protein electrophoresis and immunofixation were normal. likely MGUS.   Discussed with patient that although that he has persistent leukocytosis with predominantly neutrophilia and monocytosis, his counts has improved compared to early January, as well as his symptoms.  He has gained 3 pounds since early January.  He may be getting out of an  episode of virus infection, or he has underlying bone marrow disease. We will continue to monitor his counts in 2 weeks, if trending up or persistent we will proceed with bone marrow biopsy. Patient and wife voiced understanding.  #Hypo-thyroidism:Labs showed elevated TSH, he is on synthroid 50mg.  Hypothyroidism may contribute to his fatigue.  But cannot explain weight loss.  I advised patient to discuss with cardiologist to see if this safe to increase his Synthroid.  He can either get an out prescription from primary care physician or me.  Patient voices understanding he will update me. Appointment was delayed due to recent hospital admission.   #  weight loss; etiology unknown  All questions were answered. The patient knows to call the clinic with any problems questions or concerns.  Return of visit:  2 weeks   ZEarlie Server MD, PhD Hematology Oncology CSoutheast Georgia Health System- Brunswick Campusat AGarrett County Memorial HospitalPager- [phone]/04/2018

## 2017-12-12 ENCOUNTER — Encounter: Payer: Self-pay | Admitting: Oncology

## 2017-12-12 ENCOUNTER — Other Ambulatory Visit: Payer: Self-pay

## 2017-12-12 ENCOUNTER — Inpatient Hospital Stay: Payer: Medicare Other | Attending: Oncology | Admitting: Oncology

## 2017-12-12 ENCOUNTER — Inpatient Hospital Stay: Payer: Medicare Other

## 2017-12-12 VITALS — BP 159/78 | HR 49 | Temp 97.9°F | Resp 12 | Ht 73.0 in | Wt 154.1 lb

## 2017-12-12 DIAGNOSIS — D649 Anemia, unspecified: Secondary | ICD-10-CM | POA: Insufficient documentation

## 2017-12-12 DIAGNOSIS — Z79899 Other long term (current) drug therapy: Secondary | ICD-10-CM

## 2017-12-12 DIAGNOSIS — E039 Hypothyroidism, unspecified: Secondary | ICD-10-CM | POA: Insufficient documentation

## 2017-12-12 DIAGNOSIS — R634 Abnormal weight loss: Secondary | ICD-10-CM | POA: Insufficient documentation

## 2017-12-12 DIAGNOSIS — E538 Deficiency of other specified B group vitamins: Secondary | ICD-10-CM | POA: Insufficient documentation

## 2017-12-12 DIAGNOSIS — D72821 Monocytosis (symptomatic): Secondary | ICD-10-CM | POA: Insufficient documentation

## 2017-12-12 DIAGNOSIS — I4891 Unspecified atrial fibrillation: Secondary | ICD-10-CM

## 2017-12-12 DIAGNOSIS — D72829 Elevated white blood cell count, unspecified: Secondary | ICD-10-CM

## 2017-12-12 DIAGNOSIS — D729 Disorder of white blood cells, unspecified: Secondary | ICD-10-CM

## 2017-12-12 DIAGNOSIS — D472 Monoclonal gammopathy: Secondary | ICD-10-CM

## 2017-12-12 DIAGNOSIS — Z87891 Personal history of nicotine dependence: Secondary | ICD-10-CM

## 2017-12-12 LAB — BASIC METABOLIC PANEL
ANION GAP: 5 (ref 5–15)
BUN: 22 mg/dL — AB (ref 6–20)
CHLORIDE: 109 mmol/L (ref 101–111)
CO2: 26 mmol/L (ref 22–32)
Calcium: 7.6 mg/dL — ABNORMAL LOW (ref 8.9–10.3)
Creatinine, Ser: 1.7 mg/dL — ABNORMAL HIGH (ref 0.61–1.24)
GFR calc Af Amer: 45 mL/min — ABNORMAL LOW (ref >60)
GFR, EST NON AFRICAN AMERICAN: 38 mL/min — AB (ref >60)
GLUCOSE: 136 mg/dL — AB (ref 65–99)
POTASSIUM: 4.4 mmol/L (ref 3.5–5.1)
Sodium: 140 mmol/L (ref 135–145)

## 2017-12-12 LAB — CBC WITH DIFFERENTIAL/PLATELET
Basophils Absolute: 0.1 10*3/uL (ref 0–0.1)
Basophils Relative: 1 %
EOS PCT: 2 %
Eosinophils Absolute: 0.2 10*3/uL (ref 0–0.7)
HEMATOCRIT: 33.2 % — AB (ref 40.0–52.0)
Hemoglobin: 11.1 g/dL — ABNORMAL LOW (ref 13.0–18.0)
LYMPHS ABS: 2 10*3/uL (ref 1.0–3.6)
LYMPHS PCT: 13 %
MCH: 29.9 pg (ref 26.0–34.0)
MCHC: 33.3 g/dL (ref 32.0–36.0)
MCV: 89.9 fL (ref 80.0–100.0)
MONOS PCT: 8 %
Monocytes Absolute: 1.2 10*3/uL — ABNORMAL HIGH (ref 0.2–1.0)
NEUTROS ABS: 11.6 10*3/uL — AB (ref 1.4–6.5)
Neutrophils Relative %: 76 %
PLATELETS: 216 10*3/uL (ref 150–440)
RBC: 3.7 MIL/uL — ABNORMAL LOW (ref 4.40–5.90)
RDW: 16.3 % — AB (ref 11.5–14.5)
WBC: 15.2 10*3/uL — ABNORMAL HIGH (ref 3.8–10.6)

## 2017-12-12 LAB — FOLATE: FOLATE: 21 ng/mL (ref >5.9)

## 2017-12-12 NOTE — Progress Notes (Signed)
Patient here for follow up. He has had a second colonoscopy and would like the results for his UA.

## 2017-12-17 ENCOUNTER — Other Ambulatory Visit: Payer: Self-pay | Admitting: Oncology

## 2017-12-17 ENCOUNTER — Ambulatory Visit: Payer: Self-pay | Admitting: Nurse Practitioner

## 2017-12-19 ENCOUNTER — Ambulatory Visit: Payer: Self-pay | Admitting: Internal Medicine

## 2017-12-25 NOTE — Progress Notes (Signed)
Hematology/Oncology Follow up note [facility] Telephone:(336) [phone] Fax:([phone]   Patient Care Team: Lavera Guise, MD as PCP - General (Internal Medicine)  REFERRING PROVIDER: Merlyn Lot CHIEF COMPLAINTS/PURPOSE OF CONSULTATION:  Leukocytosis  HISTORY OF PRESENTING ILLNESS:  Alan Bennett is a  72 y.o.  male with PMH listed below who was referred to me for evaluation of leukocytosis.  Patient presented to ER for evaluation of 3 months weight loss, nausea and vomiting. He had blood work done which showed leukocytosis, predominantly neutrophilia and monocytosis.   Patient reports 4-6 weeks of feeling food stuck in the throat which triggers vomiting. He also has had a 15 pound weight loss over the 3 months. Denies any cough, shortness of breath. He is a former smoker, quit about 10 years ago. Denies any alcohol use.   # He has history of atrial fibrillation.# He has atrial fibrillation and is supposed to be on Eliquis for anticoagulation.  He has not started on taking it because [facility] if [facility] can be [facility].  He will have a follow-up appointment with his cardiologist and decide.  Currently not on Eliquis.  # # Patient was started on folic acid supplementation for treatment of folate deficiency. Folate level normalized.   # 11/25/2017 He had EGD/Colonoscopy  Findings includes nonbleeding internal hemorrhoids, multiple polyps in the colon which were removed and resected.  EGD findings include mild Schatzki ring, gastritis and a single duodenal polyp which was biopsied. Pathology revealed duodenal tubular adenoma, negative for high-grade dysplasia and malignancy. Stomach biopsy negative for active inflammation dyspepsia and malignancy.  Negative for H. Pylori. Multiple tubular adenoma throughout cecum, transverse colon and descending colon.  Negative for high-grade dysplasia and malignancy. Rectum hyperplastic polyp  negative for dysplasia and malignancy.  Small fragment of tubular adenoma.    # During the interval, 12/05/2017, patient presented to ER due to dark stool in the rectal bleeding for the past 5 days.  Hemoglobin dropped, considered to be secondary to lower GI bleed from polypectomy.Patient was evaluated by GI, and he had a repeat colonoscopy which demonstrated a few more polyps in the colon.  Polyps were resected and retrieved.  The pathology showed tubular adenoma/hyperplastic polyps, negative for high-grade dysplasia and malignancy. Patient is to need to follow-up with Dr. Vicente Males for duodenal adenoma removal..  #Patient also has had a chest CT chest done during admission.  There is no acute findings in the thorax.  Mild increase of abdominal adenopathy compared to scan that was done in 2018 December, most notable in the portal caval space.    INTERVAL HISTORY Alan Bennett is a 72 y.o. male who has above history reviewed by me today presents for follow up visit for management of leukocytosis, weight loss, nausea. He continues to feel tried and have another 3 pounds loss since 2 weeks ago. He feels appetite is a little bit better.   Review of Systems  Constitutional: Positive for malaise/fatigue and weight loss. Negative for chills and fever.  HENT: Negative for hearing loss and nosebleeds.   Eyes: Negative for blurred vision, photophobia and pain.  Respiratory: Negative for sputum production and shortness of breath.   Cardiovascular: Negative for chest pain, orthopnea and claudication.  Gastrointestinal: Negative for abdominal pain, blood in stool, heartburn, nausea and vomiting.  Genitourinary: Negative for frequency.  Musculoskeletal: Negative for myalgias and neck pain.  Skin: Negative for rash.  Neurological: Negative for dizziness, tremors and  weakness.  Endo/Heme/Allergies: Does not bruise/bleed easily.  Psychiatric/Behavioral: Negative for depression. The patient is not  nervous/anxious.     MEDICAL HISTORY:  Past Medical History:  Diagnosis Date  . Coronary artery disease   . Hypertension   . Renal disorder   . Thyroid disease     SURGICAL HISTORY: Past Surgical History:  Procedure Laterality Date  . COLONOSCOPY WITH PROPOFOL N/A 11/25/2017   Procedure: COLONOSCOPY WITH PROPOFOL;  Surgeon: Jonathon Bellows, MD;  Location: [facility] ENDOSCOPY;  Service: Gastroenterology;  Laterality: N/A;  . COLONOSCOPY WITH PROPOFOL N/A 12/07/2017   Procedure: COLONOSCOPY WITH PROPOFOL;  Surgeon: Lin Landsman, MD;  Location: [facility] ENDOSCOPY;  Service: Gastroenterology;  Laterality: N/A;  . ESOPHAGOGASTRODUODENOSCOPY (EGD) WITH PROPOFOL N/A 11/25/2017   Procedure: ESOPHAGOGASTRODUODENOSCOPY (EGD) WITH PROPOFOL;  Surgeon: Jonathon Bellows, MD;  Location: [facility] ENDOSCOPY;  Service: Gastroenterology;  Laterality: N/A;    SOCIAL HISTORY: Social History   Socioeconomic History  . Marital status: Married    Spouse name: Not on file  . Number of children: Not on file  . Years of education: Not on file  . Highest education level: Not on file  Social Needs  . Financial resource strain: Not on file  . Food insecurity - worry: Not on file  . Food insecurity - inability: Not on file  . Transportation needs - medical: Not on file  . Transportation needs - non-medical: Not on file  Occupational History  . Not on file  Tobacco Use  . Smoking status: Former Smoker    Years: 20.00  . Smokeless tobacco: Never Used  . Tobacco comment: 10 years ago  Substance and Sexual Activity  . Alcohol use: No    Frequency: Never  . Drug use: No  . Sexual activity: Yes  Other Topics Concern  . Not on file  Social History Narrative  . Not on file    FAMILY HISTORY: Family History  Problem Relation Age of Onset  . Pneumonia Father   . Heart attack Brother     ALLERGIES:  has No Known Allergies.  MEDICATIONS:  Current Outpatient Medications  Medication Sig Dispense Refill  .  amiodarone (PACERONE) 200 MG tablet Take 200 mg by mouth daily.     Marland Kitchen atorvastatin (LIPITOR) 40 MG tablet Take 40 mg by mouth every evening.     . folic acid (FOLVITE) 1 MG tablet Take 1 tablet (1 mg total) by mouth daily. 30 tablet 1  . isosorbide mononitrate (IMDUR) 30 MG 24 hr tablet Take 30 mg by mouth daily.     Marland Kitchen levothyroxine (SYNTHROID, LEVOTHROID) 75 MCG tablet Take 75 mcg by mouth daily.     Marland Kitchen lisinopril (PRINIVIL,ZESTRIL) 5 MG tablet Take 5 mg by mouth daily.     . Omega-3 Fatty Acids (FISH OIL) 1000 MG CAPS Take 1,000 mg by mouth daily.    Marland Kitchen omeprazole (PRILOSEC) 40 MG capsule Take 1 capsule (40 mg total) by mouth daily. 30 capsule 1  . ondansetron (ZOFRAN) 4 MG tablet TAKE 1 TABLET BY MOUTH EVERY 12 HOURS AS NEEDED FOR NAUSEA AND VOMITING 60 tablet 0   No current facility-administered medications for this visit.      PHYSICAL EXAMINATION: ECOG PERFORMANCE STATUS: 1 - Symptomatic but completely ambulatory Vitals:   12/26/17 0840  BP: 125/63  Pulse: 61  Temp: 98.5 F (36.9 C)   Filed Weights   12/26/17 0840  Weight: 151 lb 3.8 oz (68.6 kg)    Physical Exam  Constitutional: He  is oriented to person, place, and time. No distress.  Thin.   HENT:  Head: Normocephalic and atraumatic.  Mouth/Throat: Oropharynx is clear and moist. No oropharyngeal exudate.  Eyes: EOM are normal. Pupils are equal, round, and reactive to light. Right eye exhibits no discharge. No scleral icterus.  Neck: Normal range of motion. Neck supple.  Cardiovascular: Normal heart sounds.  No murmur heard. Irregular heart beats  Pulmonary/Chest: Effort normal and breath sounds normal. No respiratory distress. He has no wheezes. He has no rales.  Abdominal: Soft. Bowel sounds are normal. He exhibits no distension. There is no tenderness. There is no rebound.  Musculoskeletal: Normal range of motion. He exhibits no edema or tenderness.  Lymphadenopathy:    He has no cervical adenopathy.  Neurological:  He is alert and oriented to person, place, and time. He displays normal reflexes. No cranial nerve deficit.  Skin: Skin is warm and dry. No erythema.  Psychiatric: Affect normal.     LABORATORY DATA:  I have reviewed the data as listed Lab Results  Component Value Date   WBC 18.4 (H) 12/26/2017   HGB 11.7 (L) 12/26/2017   HCT 35.7 (L) 12/26/2017   MCV 89.3 12/26/2017   PLT 201 12/26/2017   Recent Labs    10/15/17 1628 10/24/17 0919 12/05/17 1027 12/06/17 0519 12/12/17 0948  NA 139 139 140 142 140  K 4.5 4.2 4.3 4.3 4.4  CL 103 103 109 112* 109  CO2 25 28 21* 23 26  GLUCOSE 151* 96 120* 85 136*  BUN 26* 21* 26* 18 22*  CREATININE 2.58* 2.07* 1.75* 1.51* 1.70*  CALCIUM 8.5* 7.5* 8.0* 7.8* 7.6*  GFRNONAA 23* 30* 37* 44* 38*  GFRAA 27* 35* 43* 51* 45*  PROT 7.7 6.5 6.2*  --   --   ALBUMIN 3.2* 2.8* 2.5*  --   --   AST 111* 76* 113*  --   --   ALT 67* 49 53  --   --   ALKPHOS 149* 138* 234*  --   --   BILITOT 0.9 1.0 0.8  --   --    Negative hepatitis panel LDH 157 Negative flowcytometry Negative BCR ABL UA showed proteinuria #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   SPEP showed 0.3g/dl polyclonal IgA with both Kappa and Lamda increased. Normal free light chain ratio. UPEP negative.   ASSESSMENT & PLAN:  1. Leukocytosis, unspecified type   2. Neutrophilia   3. Weight loss   4. Hypothyroidism, unspecified type   5. Anemia, unspecified type   6. Monocytosis    Persistent Leukocytosis predominantly neutrophilia and monocytosis, trending up again. 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  BCR ABL negative.  I had a lengthy discussion with patient and wife about bone marrow biopsy. Rationale and possible complication discussed with patient and his wife. Patient was hesitant about whether he will proceed this procedure but eventually he made up his mind to proceed with bone marrow biopsy.   I explained to them  that bone marrow evaluation results may take weeks as sometimes, cytogenetics take time to come back. I offer them to come back one week after BM biopsy to discuss about morphological evaluation impression. Patient and wife prefer to come after cytogenetics are completed,which likely will be 3 weeks after BM biopsy.   #Folate deficiency resolved after folate supplementation.  #Hypo-thyroidism:Labs showed elevated TSH, he is on synthroid 78mg.  Hypothyroidism may contribute to his fatigue.  But cannot explain weight loss.  I advised patient to discuss with cardiologist to see if this safe to increase his Synthroid.  He can either get an out prescription from primary care physician or me.  Patient voices understanding he will update me. Appointment was delayed due to recent hospital admission.   #  weight loss; etiology unknown. Pending work up.   All questions were answered. The patient knows to call the clinic with any problems questions or concerns. Total face to face encounter time for this patient visit was 45 min. >50% of the time was  spent in counseling and coordination of care.  Return of visit:  3 weeks after bone marrow biopsy.  Earlie Server, MD, PhD Hematology Oncology [facility] Pager- [phone] 12/26/2017

## 2017-12-26 ENCOUNTER — Ambulatory Visit (INDEPENDENT_AMBULATORY_CARE_PROVIDER_SITE_OTHER): Payer: Medicare Other | Admitting: Gastroenterology

## 2017-12-26 ENCOUNTER — Encounter: Payer: Self-pay | Admitting: Gastroenterology

## 2017-12-26 ENCOUNTER — Inpatient Hospital Stay (HOSPITAL_BASED_OUTPATIENT_CLINIC_OR_DEPARTMENT_OTHER): Payer: Medicare Other | Admitting: Oncology

## 2017-12-26 ENCOUNTER — Encounter: Payer: Self-pay | Admitting: Oncology

## 2017-12-26 ENCOUNTER — Other Ambulatory Visit: Payer: Self-pay

## 2017-12-26 ENCOUNTER — Inpatient Hospital Stay: Payer: Medicare Other

## 2017-12-26 VITALS — BP 125/63 | HR 61 | Temp 98.5°F | Wt 151.2 lb

## 2017-12-26 VITALS — BP 150/74 | HR 52 | Temp 97.7°F | Ht 73.0 in | Wt 152.4 lb

## 2017-12-26 DIAGNOSIS — D72821 Monocytosis (symptomatic): Secondary | ICD-10-CM

## 2017-12-26 DIAGNOSIS — E538 Deficiency of other specified B group vitamins: Secondary | ICD-10-CM | POA: Diagnosis not present

## 2017-12-26 DIAGNOSIS — R634 Abnormal weight loss: Secondary | ICD-10-CM

## 2017-12-26 DIAGNOSIS — D72829 Elevated white blood cell count, unspecified: Secondary | ICD-10-CM

## 2017-12-26 DIAGNOSIS — D729 Disorder of white blood cells, unspecified: Secondary | ICD-10-CM

## 2017-12-26 DIAGNOSIS — D649 Anemia, unspecified: Secondary | ICD-10-CM | POA: Diagnosis not present

## 2017-12-26 DIAGNOSIS — K7460 Unspecified cirrhosis of liver: Secondary | ICD-10-CM | POA: Diagnosis not present

## 2017-12-26 DIAGNOSIS — E039 Hypothyroidism, unspecified: Secondary | ICD-10-CM | POA: Diagnosis not present

## 2017-12-26 DIAGNOSIS — D132 Benign neoplasm of duodenum: Secondary | ICD-10-CM | POA: Diagnosis not present

## 2017-12-26 DIAGNOSIS — I4891 Unspecified atrial fibrillation: Secondary | ICD-10-CM | POA: Diagnosis not present

## 2017-12-26 LAB — CBC WITH DIFFERENTIAL/PLATELET
BASOS PCT: 1 %
Basophils Absolute: 0.2 10*3/uL — ABNORMAL HIGH (ref 0–0.1)
EOS PCT: 1 %
Eosinophils Absolute: 0.2 10*3/uL (ref 0–0.7)
HEMATOCRIT: 35.7 % — AB (ref 40.0–52.0)
Hemoglobin: 11.7 g/dL — ABNORMAL LOW (ref 13.0–18.0)
LYMPHS PCT: 11 %
Lymphs Abs: 1.9 10*3/uL (ref 1.0–3.6)
MCH: 29.4 pg (ref 26.0–34.0)
MCHC: 32.9 g/dL (ref 32.0–36.0)
MCV: 89.3 fL (ref 80.0–100.0)
MONO ABS: 1.2 10*3/uL — AB (ref 0.2–1.0)
Monocytes Relative: 7 %
NEUTROS ABS: 14.9 10*3/uL — AB (ref 1.4–6.5)
Neutrophils Relative %: 80 %
Platelets: 201 10*3/uL (ref 150–440)
RBC: 3.99 MIL/uL — ABNORMAL LOW (ref 4.40–5.90)
RDW: 16.4 % — AB (ref 11.5–14.5)
WBC: 18.4 10*3/uL — ABNORMAL HIGH (ref 3.8–10.6)

## 2017-12-26 NOTE — Progress Notes (Signed)
   MD, MRCP(U.K) [address]  [address]  [place], [place] [address]  Main: [phone]  Fax: [phone]   Primary Care Physician: Khan, Fozia M, MD  Primary Gastroenterologist:  Dr.     Chief Complaint  Patient presents with  . Follow-up    HPI: Alan Bennett is a 71 y.o. male   Summary of history :  He was initially referred and seen on 11/14/17 for nausea, vomiting and weight loss. Ex smoker . On xarelto . TSH elevated.Low folate 5.8 . B12 normal , iron low normal. Hb 13.4 grams. Documented loss of weight 30 lbs over the last 6 months per Epic. Apetite was poor and says is now better. No issues swallowing , no abdominal pain. He says that not having regular bowel movements, no blood in the stool. Not had a drink in 20 years, Ex smoker quit 10 years back . He says that he gets nausea and vomiting about 3 times a week , began 10/2017 and lasted 3 weeks and  stooped.     Interval history   11/14/2016-  12/26/2016   12/05/17- CT chest :Mild increase in abdominal adenopathy when compared with December of 2018. This is most notable in the portacaval space as described. Features of liver cirrhosis seen . No acute intrathoracic abnormality is seen.   11/25/17 : EGD- small polyp in the duodenum ,bs shows adenoma ,  Colonoscopy >10 adenomas resected, had a post polypectomy bleed, came back into the hospital , had a repeat colonoscopy and further polyps were taken out.   He follows with DR Yu for abdominal adenopathy Gaining weight , apetite good. No more rectal bleeding. Never consumed alcohol in excess, no tatoos, denies being obese in the past . He served in the military and served in [place] .  He is undergoing a bone marrow biopsy soon. He has been on amiodarone for 10 years.    Current Outpatient Medications  Medication Sig Dispense Refill  . amiodarone (PACERONE) 200 MG tablet Take 200 mg by mouth daily.     . atorvastatin (LIPITOR) 40 MG tablet Take  40 mg by mouth every evening.     . Docusate Calcium (STOOL SOFTENER PO) Take by mouth.    . isosorbide mononitrate (IMDUR) 30 MG 24 hr tablet Take 30 mg by mouth daily.     . levothyroxine (SYNTHROID, LEVOTHROID) 75 MCG tablet Take 75 mcg by mouth daily.     . lisinopril (PRINIVIL,ZESTRIL) 5 MG tablet Take 5 mg by mouth daily.     . Multiple Vitamin (MULTIVITAMIN) tablet Take 1 tablet by mouth daily.    . Omega-3 Fatty Acids (FISH OIL) 1000 MG CAPS Take 1,000 mg by mouth daily.    . omeprazole (PRILOSEC) 40 MG capsule Take 1 capsule (40 mg total) by mouth daily. 30 capsule 1  . ondansetron (ZOFRAN) 4 MG tablet TAKE 1 TABLET BY MOUTH EVERY 12 HOURS AS NEEDED FOR NAUSEA AND VOMITING 60 tablet 0  . Psyllium (METAMUCIL FIBER PO) Take by mouth.     No current facility-administered medications for this visit.     Allergies as of 12/26/2017  . (No Known Allergies)    ROS:  General: Negative for anorexia, weight loss, fever, chills, fatigue, weakness. ENT: Negative for hoarseness, difficulty swallowing , nasal congestion. CV: Negative for chest pain, angina, palpitations, dyspnea on exertion, peripheral edema.  Respiratory: Negative for dyspnea at rest, dyspnea on exertion, cough, sputum, wheezing.  GI: See history of   present illness. GU:  Negative for dysuria, hematuria, urinary incontinence, urinary frequency, nocturnal urination.  Endo: Negative for unusual weight change.    Physical Examination:   BP (!) 150/74 (BP Location: Left Arm, Patient Position: Sitting, Cuff Size: Normal)   Pulse (!) 52   Temp 97.7 F (36.5 C) (Oral)   Ht 6' 1" (1.854 m)   Wt 152 lb 6.4 oz (69.1 kg)   BMI 20.11 kg/m   General: Well-nourished, well-developed in no acute distress.  Eyes: No icterus. Conjunctivae pink. Mouth: Oropharyngeal mucosa moist and pink , no lesions erythema or exudate. Lungs: Clear to auscultation bilaterally. Non-labored. Heart: Regular rate and rhythm, no murmurs rubs or  gallops.  Abdomen: Bowel sounds are normal, nontender, nondistended, no hepatosplenomegaly or masses, no abdominal bruits or hernia , no rebound or guarding.   Extremities: No lower extremity edema. No clubbing or deformities. Neuro: Alert and oriented x 3.  Grossly intact. Skin: Warm and dry, no jaundice.   Psych: Alert and cooperative, normal mood and affect.   Imaging Studies: Dg Chest 2 View  Result Date: 11/28/2017 CLINICAL DATA:  Leukocytosis and weight loss EXAM: CHEST  2 VIEW COMPARISON:  03/03/2013 FINDINGS: Heart size and vascularity normal. Mild hyperinflation. Mild apical scarring bilaterally. Negative for infiltrate effusion or mass. IMPRESSION: COPD without acute abnormality. Electronically Signed   By: Charles  Clark M.D.   On: 11/28/2017 13:59   Ct Head Wo Contrast  Result Date: 12/07/2017 CLINICAL DATA:  Unintended weight loss. EXAM: CT HEAD WITHOUT CONTRAST TECHNIQUE: Contiguous axial images were obtained from the base of the skull through the vertex without intravenous contrast. COMPARISON:  None. FINDINGS: Brain: No subdural, epidural, or subarachnoid hemorrhage. Cerebellum, brainstem, and basal cisterns are normal. Ventricles and sulci are mildly prominent, likely due to mild atrophy. No acute cortical ischemia or infarct. No mass effect or midline shift. Vascular: No hyperdense vessel or unexpected calcification. Skull: Normal. Negative for fracture or focal lesion. Sinuses/Orbits: No acute finding. Other: None. IMPRESSION: No acute intracranial abnormality to explain the patient's weight loss. Electronically Signed   By: David  Williams III M.D   On: 12/07/2017 10:22   Ct Chest W Contrast  Result Date: 12/05/2017 CLINICAL DATA:  Elevated white blood cell count and weight loss EXAM: CT CHEST WITH CONTRAST TECHNIQUE: Multidetector CT imaging of the chest was performed during intravenous contrast administration. CONTRAST:  60mL ISOVUE-300 IOPAMIDOL (ISOVUE-300) INJECTION 61%  COMPARISON:  11/28/2017 chest x-ray, CT of the abdomen 10/15/2017, CT of the chest 10/28/2008. FINDINGS: Cardiovascular: The aorta demonstrates atherosclerotic calcification without aneurysmal dilatation or dissection. No significant cardiac enlargement is noted. Coronary calcifications are seen. Pulmonary artery as visualized demonstrates no large central pulmonary embolus. The left vertebral artery arises directly from the aorta. Mediastinum/Nodes: Thoracic inlet is within normal limits. Scattered small mediastinal lymph nodes are noted. No significant hilar or mediastinal adenopathy is seen. The esophagus is within normal limits. Lungs/Pleura: Diffuse emphysematous changes are again identified. Mild scarring is noted in the right lung base posteriorly. No focal infiltrate or sizable effusion is seen. No parenchymal nodules are noted. Upper Abdomen: Left hepatic cyst is noted slightly larger than that seen on the prior exam now measuring 2.1 cm in greatest dimension. Mild nodularity to the liver is noted consistent with underlying cirrhotic change. No focal mass lesion is noted. Spleen is within normal limits. Scattered upper abdominal adenopathy is noted. The largest of these are noted in the portacaval space measuring 16 mm in short axis. Bilateral renal   cystic changes are noted. The right kidney is somewhat shrunken similar to that seen on prior exam. Musculoskeletal: Degenerative changes of the thoracic spine are noted. IMPRESSION: Mild increase in abdominal adenopathy when compared with December of 2018. This is most notable in the portacaval space as described. Heterogeneity and nodularity within the liver consistent with underlying cirrhotic change. This is also more marked when compared with the prior exam. CT of the abdomen and pelvis may be helpful for further evaluation. No acute intrathoracic abnormality is seen. Aortic Atherosclerosis (ICD10-I70.0) and Emphysema (ICD10-J43.9). Electronically Signed    By: Mark  Lukens M.D.   On: 12/05/2017 11:51    Assessment and Plan:   Alan Bennett is a 71 y.o. y/o malehere to follow up for unintentional weight loss >30lbs last 6 months. Abdominal adenopathy seen on CT scan and follows with Dr Yu. GI evaluation revealed a duodenal adenoma which needs resection, multiple colon polyps which were taken out. Incidentally found to have cirrhosis of the liver on imaging . Being treated for hypothyroidism. No hepatic encephalopathy . Etiology for cirrhosis may be the amiodarone which he has been for atleast 10 years . I will ensure we rule out other causes   Plan  1. Screen for viral and autoimmune hepatitis , will check immunity for Hep A/B and if negative will need vaccination  2. EGD- needs to be repeated to remove duodenal adenoma.  3. Repeat colonoscopy in 6 months as prior polyp taken out piece meal.  4. RUQ USG in 6 months to screen for HCC 5. Labs to calculate MELD and CP score.   I have discussed alternative options, risks & benefits,  which include, but are not limited to, bleeding, infection, perforation,respiratory complication & drug reaction.  The patient agrees with this plan & written consent will be obtained.      Dr    MD,MRCP (U.K) Follow up in 3 months    

## 2017-12-26 NOTE — Progress Notes (Signed)
Patient here today for follow up.   

## 2017-12-30 DIAGNOSIS — K7460 Unspecified cirrhosis of liver: Secondary | ICD-10-CM | POA: Diagnosis not present

## 2018-01-01 ENCOUNTER — Encounter: Payer: Self-pay | Admitting: Internal Medicine

## 2018-01-01 ENCOUNTER — Ambulatory Visit (INDEPENDENT_AMBULATORY_CARE_PROVIDER_SITE_OTHER): Payer: Medicare Other | Admitting: Internal Medicine

## 2018-01-01 VITALS — BP 130/96 | HR 67 | Resp 16 | Ht 73.0 in | Wt 152.4 lb

## 2018-01-01 DIAGNOSIS — I25118 Atherosclerotic heart disease of native coronary artery with other forms of angina pectoris: Secondary | ICD-10-CM

## 2018-01-01 DIAGNOSIS — R630 Anorexia: Secondary | ICD-10-CM | POA: Diagnosis not present

## 2018-01-01 DIAGNOSIS — K625 Hemorrhage of anus and rectum: Secondary | ICD-10-CM | POA: Diagnosis not present

## 2018-01-01 DIAGNOSIS — N183 Chronic kidney disease, stage 3 unspecified: Secondary | ICD-10-CM

## 2018-01-01 DIAGNOSIS — Z125 Encounter for screening for malignant neoplasm of prostate: Secondary | ICD-10-CM

## 2018-01-01 DIAGNOSIS — D508 Other iron deficiency anemias: Secondary | ICD-10-CM | POA: Diagnosis not present

## 2018-01-01 DIAGNOSIS — E782 Mixed hyperlipidemia: Secondary | ICD-10-CM | POA: Diagnosis not present

## 2018-01-01 DIAGNOSIS — E039 Hypothyroidism, unspecified: Secondary | ICD-10-CM

## 2018-01-01 LAB — MITOCHONDRIAL/SMOOTH MUSCLE AB PNL
Mitochondrial Ab: <20 Units (ref 0.0–20.0)
Smooth Muscle Ab: 7 Units (ref 0–19)

## 2018-01-01 LAB — HEPATITIS B SURFACE ANTIGEN: Hepatitis B Surface Ag: NEGATIVE

## 2018-01-01 LAB — PROTIME-INR
INR: 1.1 (ref 0.8–1.2)
Prothrombin Time: 11.8 s (ref 9.1–12.0)

## 2018-01-01 LAB — IRON,TIBC AND FERRITIN PANEL
Ferritin: 193 ng/mL (ref 30–400)
IRON: 25 ug/dL — AB (ref 38–169)
Iron Saturation: 12 % — ABNORMAL LOW (ref 15–55)
Total Iron Binding Capacity: 214 ug/dL — ABNORMAL LOW (ref 250–450)
UIBC: 189 ug/dL (ref 111–343)

## 2018-01-01 LAB — IMMUNOGLOBULINS A/E/G/M, SERUM
IgE (Immunoglobulin E), Serum: 300 IU/mL — ABNORMAL HIGH (ref 0–100)
IgG (Immunoglobin G), Serum: 941 mg/dL (ref 700–1600)
IgM (Immunoglobulin M), Srm: 84 mg/dL (ref 15–143)

## 2018-01-01 LAB — HEPATITIS B SURFACE ANTIBODY,QUALITATIVE: Hep B Surface Ab, Qual: NONREACTIVE

## 2018-01-01 LAB — CELIAC DISEASE PANEL
Endomysial IgA: NEGATIVE
IGA/IMMUNOGLOBULIN A, SERUM: 1094 mg/dL — AB (ref 61–437)
Transglutaminase IgA: 2 U/mL (ref 0–3)

## 2018-01-01 LAB — ANA W/REFLEX: ANA: NEGATIVE

## 2018-01-01 LAB — HEPATITIS C ANTIBODY

## 2018-01-01 LAB — ALPHA-1-ANTITRYPSIN: A-1 Antitrypsin: 247 mg/dL — ABNORMAL HIGH (ref 90–200)

## 2018-01-01 LAB — HEPATITIS B CORE ANTIBODY, TOTAL: Hep B Core Total Ab: NEGATIVE

## 2018-01-01 LAB — HEPATITIS A ANTIBODY, TOTAL: HEP A TOTAL AB: NEGATIVE

## 2018-01-01 LAB — CERULOPLASMIN: Ceruloplasmin: 27.9 mg/dL (ref 16.0–31.0)

## 2018-01-01 LAB — ANTI-MICROSOMAL ANTIBODY LIVER / KIDNEY: LKM1 AB: 0.8 U (ref 0.0–20.0)

## 2018-01-01 MED ORDER — MIRTAZAPINE 15 MG PO TABS: ORAL_TABLET | ORAL | 12 refills | Status: DC

## 2018-01-01 NOTE — Progress Notes (Signed)
[facility] [place], [place] [address]  Internal MEDICINE  Office Visit Note  Patient Name: Alan Bennett  [id number]  [id number]  Date of Service: 02/06/2018     Chief Complaint  Patient presents with  . Hospitalization Follow-up     HPI Pt is here for recent hospital follow up. Pt was admitted with lower GI bleed 5 days after his colonoscopy and polypectomy. Pt was admitted for evaluations. He continues to drink. Other medical problems include CKD, CAD with decreased EF, HTN. He feels weak and has poor po intake . Lack of appetite  Current Medication: Outpatient Encounter Medications as of 01/01/2018  Medication Sig  . amiodarone (PACERONE) 200 MG tablet Take 200 mg by mouth daily.   Marland Kitchen atorvastatin (LIPITOR) 40 MG tablet Take 40 mg by mouth every evening.   Mariane Baumgarten Calcium (STOOL SOFTENER PO) Take by mouth.  . folic acid (FOLVITE) 1 MG tablet Take 1 mg by mouth daily.  . isosorbide mononitrate (IMDUR) 30 MG 24 hr tablet Take 30 mg by mouth daily.   Marland Kitchen levothyroxine (SYNTHROID, LEVOTHROID) 75 MCG tablet Take 75 mcg by mouth daily.   Marland Kitchen lisinopril (PRINIVIL,ZESTRIL) 5 MG tablet Take 5 mg by mouth daily.   . Multiple Vitamin (MULTIVITAMIN) tablet Take 1 tablet by mouth daily.  . Omega-3 Fatty Acids (FISH OIL) 1000 MG CAPS Take 1,000 mg by mouth daily.  . ondansetron (ZOFRAN) 4 MG tablet TAKE 1 TABLET BY MOUTH EVERY 12 HOURS AS NEEDED FOR NAUSEA AND VOMITING  . Psyllium (METAMUCIL FIBER PO) Take by mouth.  . [DISCONTINUED] omeprazole (PRILOSEC) 40 MG capsule Take 1 capsule (40 mg total) by mouth daily.  . mirtazapine (REMERON) 15 MG tablet Take half to one tab at night for sleep and appetite (Patient not taking: Reported on 01/08/2018)   No facility-administered encounter medications on file as of 01/01/2018.     Surgical History: Past Surgical History:  Procedure Laterality Date  . COLONOSCOPY WITH PROPOFOL N/A 11/25/2017   Procedure: COLONOSCOPY  WITH PROPOFOL;  Surgeon: Jonathon Bellows, MD;  Location: [facility] ENDOSCOPY;  Service: Gastroenterology;  Laterality: N/A;  . COLONOSCOPY WITH PROPOFOL N/A 12/07/2017   Procedure: COLONOSCOPY WITH PROPOFOL;  Surgeon: Lin Landsman, MD;  Location: [facility] ENDOSCOPY;  Service: Gastroenterology;  Laterality: N/A;  . ESOPHAGOGASTRODUODENOSCOPY (EGD) WITH PROPOFOL N/A 11/25/2017   Procedure: ESOPHAGOGASTRODUODENOSCOPY (EGD) WITH PROPOFOL;  Surgeon: Jonathon Bellows, MD;  Location: [facility] ENDOSCOPY;  Service: Gastroenterology;  Laterality: N/A;    Medical History: Past Medical History:  Diagnosis Date  . Coronary artery disease   . Hypertension   . Renal disorder   . Thyroid disease     Family History: Family History  Problem Relation Age of Onset  . Pneumonia Father   . Heart attack Brother     Social History   Socioeconomic History  . Marital status: Married    Spouse name: Not on file  . Number of children: Not on file  . Years of education: Not on file  . Highest education level: Not on file  Occupational History  . Not on file  Social Needs  . Financial resource strain: Not on file  . Food insecurity:    Worry: Not on file    Inability: Not on file  . Transportation needs:    Medical: Not on file    Non-medical: Not on file  Tobacco Use  . Smoking status: Former Smoker    Years: 20.00  . Smokeless tobacco:  Never Used  . Tobacco comment: 10 years ago  Substance and Sexual Activity  . Alcohol use: No    Frequency: Never  . Drug use: No  . Sexual activity: Yes  Lifestyle  . Physical activity:    Days per week: Not on file    Minutes per session: Not on file  . Stress: Not on file  Relationships  . Social connections:    Talks on phone: Not on file    Gets together: Not on file    Attends religious service: Not on file    Active member of club or organization: Not on file    Attends meetings of clubs or organizations: Not on file    Relationship status: Not on file  .  Intimate partner violence:    Fear of current or ex partner: Not on file    Emotionally abused: Not on file    Physically abused: Not on file    Forced sexual activity: Not on file  Other Topics Concern  . Not on file  Social History Narrative  . Not on file      Review of Systems  Constitutional: Positive for unexpected weight change. Negative for chills and fatigue.  HENT: Negative for congestion, postnasal drip, rhinorrhea, sneezing and sore throat.   Eyes: Negative for redness.  Respiratory: Positive for shortness of breath. Negative for cough and chest tightness.   Cardiovascular: Positive for palpitations. Negative for chest pain.  Gastrointestinal: Negative for abdominal pain, constipation, diarrhea, nausea and vomiting.  Genitourinary: Negative for dysuria and frequency.  Musculoskeletal: Negative for arthralgias, back pain, joint swelling and neck pain.  Skin: Negative for rash.  Neurological: Negative.  Negative for tremors and numbness.  Hematological: Negative for adenopathy. Does not bruise/bleed easily.  Psychiatric/Behavioral: Negative for behavioral problems (Depression), sleep disturbance and suicidal ideas. The patient is not nervous/anxious.     Vital Signs: BP (!) 130/96 (BP Location: Left Arm, Patient Position: Sitting)   Pulse 67   Resp 16   Ht 6\' 1"  (1.854 m)   Wt 152 lb 6.4 oz (69.1 kg)   SpO2 95%   BMI 20.11 kg/m    Physical Exam  Constitutional: He is oriented to person, place, and time. He appears well-developed and well-nourished. No distress.  HENT:  Head: Normocephalic and atraumatic.  Mouth/Throat: Oropharynx is clear and moist. No oropharyngeal exudate.  Eyes: Pupils are equal, round, and reactive to light. EOM are normal.  Neck: Normal range of motion. Neck supple. No JVD present. No tracheal deviation present. No thyromegaly present.  Cardiovascular: Normal rate, regular rhythm and normal heart sounds. Exam reveals no gallop and no  friction rub.  No murmur heard. Pulmonary/Chest: Effort normal. No respiratory distress. He has no wheezes. He has no rales. He exhibits no tenderness.  Abdominal: Soft. Bowel sounds are normal.  Musculoskeletal: Normal range of motion.  Lymphadenopathy:    He has no cervical adenopathy.  Neurological: He is alert and oriented to person, place, and time. No cranial nerve deficit.  Skin: Skin is warm and dry. He is not diaphoretic.  Psychiatric: He has a normal mood and affect. His behavior is normal. Judgment and thought content normal.   Assessment/Plan: 1. Rectal bleeding Continue f/u with GI  2. Iron deficiency anemia secondary to inadequate dietary iron intake Continue to see Hematology  3. Lack of appetite Start mirtazapine (REMERON) 15 MG tablet; Take half to one tab at night for sleep and appetite (Patient not taking: Reported on  01/08/2018)  Dispense: 30 tablet; Refill: 12  4. Chronic kidney disease, stage III (moderate) [facility]) Nephrology  5. Screening PSA (prostate specific antigen) PSA ordered  6. Coronary artery disease of native artery of native heart with stable angina pectoris [facility]) Cardiology  7. Mixed hyperlipidemia Continue Lipitor   8. Hypothyroidism, unspecified type Continue Synthroid   General Counseling: nadav swindell understanding of the findings of todays visit and agrees with plan of treatment. I have discussed any further diagnostic evaluation that may be needed or ordered today. We also reviewed his medications today. he has been encouraged to call the office with any questions or concerns that should arise related to todays visit.   I have reviewed all medical records from hospital follow up including radiology reports and consults from other physicians. Appropriate follow up diagnostics will be scheduled as needed. Patient/ Family understands the plan of treatment. Time spent25 minutes.   Dr Lavera Guise, MD Internal Medicine

## 2018-01-03 DIAGNOSIS — I10 Essential (primary) hypertension: Secondary | ICD-10-CM | POA: Diagnosis not present

## 2018-01-03 DIAGNOSIS — N183 Chronic kidney disease, stage 3 (moderate): Secondary | ICD-10-CM | POA: Diagnosis not present

## 2018-01-03 DIAGNOSIS — I5022 Chronic systolic (congestive) heart failure: Secondary | ICD-10-CM | POA: Diagnosis not present

## 2018-01-06 ENCOUNTER — Telehealth: Payer: Self-pay

## 2018-01-06 NOTE — Telephone Encounter (Signed)
Advised patient of lab results per Dr. Vicente Males.    - Needs hep a/b vaccine if not already done

## 2018-01-07 ENCOUNTER — Ambulatory Visit: Payer: Medicare Other

## 2018-01-08 ENCOUNTER — Ambulatory Visit
Admission: RE | Admit: 2018-01-08 | Discharge: 2018-01-08 | Disposition: A | Discharge status: Home / Self Care | Payer: Medicare Other | Source: Ambulatory Visit | Attending: Oncology | Admitting: Oncology

## 2018-01-08 ENCOUNTER — Other Ambulatory Visit (HOSPITAL_COMMUNITY)
Admission: RE | Admit: 2018-01-08 | Disposition: A | Discharge status: Home / Self Care | Payer: Medicare Other | Source: Ambulatory Visit | Attending: Oncology | Admitting: Oncology

## 2018-01-08 DIAGNOSIS — Z79899 Other long term (current) drug therapy: Secondary | ICD-10-CM | POA: Insufficient documentation

## 2018-01-08 DIAGNOSIS — R634 Abnormal weight loss: Secondary | ICD-10-CM | POA: Diagnosis not present

## 2018-01-08 DIAGNOSIS — D729 Disorder of white blood cells, unspecified: Secondary | ICD-10-CM

## 2018-01-08 DIAGNOSIS — I4891 Unspecified atrial fibrillation: Secondary | ICD-10-CM | POA: Diagnosis not present

## 2018-01-08 DIAGNOSIS — D649 Anemia, unspecified: Secondary | ICD-10-CM | POA: Diagnosis not present

## 2018-01-08 DIAGNOSIS — Z87891 Personal history of nicotine dependence: Secondary | ICD-10-CM | POA: Insufficient documentation

## 2018-01-08 DIAGNOSIS — Z6820 Body mass index (BMI) 20.0-20.9, adult: Secondary | ICD-10-CM | POA: Insufficient documentation

## 2018-01-08 DIAGNOSIS — Z79890 Hormone replacement therapy (postmenopausal): Secondary | ICD-10-CM | POA: Insufficient documentation

## 2018-01-08 DIAGNOSIS — D72829 Elevated white blood cell count, unspecified: Secondary | ICD-10-CM | POA: Diagnosis not present

## 2018-01-08 DIAGNOSIS — D7589 Other specified diseases of blood and blood-forming organs: Secondary | ICD-10-CM | POA: Diagnosis not present

## 2018-01-08 DIAGNOSIS — I10 Essential (primary) hypertension: Secondary | ICD-10-CM | POA: Insufficient documentation

## 2018-01-08 DIAGNOSIS — I2510 Atherosclerotic heart disease of native coronary artery without angina pectoris: Secondary | ICD-10-CM | POA: Diagnosis not present

## 2018-01-08 DIAGNOSIS — D72821 Monocytosis (symptomatic): Secondary | ICD-10-CM

## 2018-01-08 LAB — CBC WITH DIFFERENTIAL/PLATELET
BASOS PCT: 1 %
Basophils Absolute: 0.1 10*3/uL (ref 0–0.1)
EOS ABS: 0.2 10*3/uL (ref 0–0.7)
EOS PCT: 1 %
HEMATOCRIT: 33.9 % — AB (ref 40.0–52.0)
Hemoglobin: 11.1 g/dL — ABNORMAL LOW (ref 13.0–18.0)
Lymphocytes Relative: 12 %
Lymphs Abs: 2.1 10*3/uL (ref 1.0–3.6)
MCH: 29.1 pg (ref 26.0–34.0)
MCHC: 32.6 g/dL (ref 32.0–36.0)
MCV: 89.1 fL (ref 80.0–100.0)
MONO ABS: 1 10*3/uL (ref 0.2–1.0)
MONOS PCT: 6 %
Neutro Abs: 13.9 10*3/uL — ABNORMAL HIGH (ref 1.4–6.5)
Neutrophils Relative %: 80 %
PLATELETS: 188 10*3/uL (ref 150–440)
RBC: 3.8 MIL/uL — ABNORMAL LOW (ref 4.40–5.90)
RDW: 16.3 % — AB (ref 11.5–14.5)
WBC: 17.3 10*3/uL — ABNORMAL HIGH (ref 3.8–10.6)

## 2018-01-08 MED ORDER — LIDOCAINE HCL 2 % IJ SOLN
INTRAMUSCULAR | Status: AC | PRN
  Administered 2018-01-08: 8 mg via INTRADERMAL

## 2018-01-08 MED ORDER — HEPARIN SOD (PORK) LOCK FLUSH 100 UNIT/ML IV SOLN
INTRAVENOUS | Status: AC
  Administered 2018-01-08: 09:00:00
  Filled 2018-01-08: qty 5

## 2018-01-08 MED ORDER — FENTANYL CITRATE (PF) 100 MCG/2ML IJ SOLN
INTRAMUSCULAR | Status: AC
  Filled 2018-01-08: qty 4

## 2018-01-08 MED ORDER — MIDAZOLAM HCL 5 MG/5ML IJ SOLN
INTRAMUSCULAR | Status: AC
  Filled 2018-01-08: qty 10

## 2018-01-08 MED ORDER — FENTANYL CITRATE (PF) 100 MCG/2ML IJ SOLN
INTRAMUSCULAR | Status: AC | PRN
  Administered 2018-01-08: 50 ug via INTRAVENOUS

## 2018-01-08 MED ORDER — MIDAZOLAM HCL 2 MG/2ML IJ SOLN
INTRAMUSCULAR | Status: AC | PRN
  Administered 2018-01-08 (×2): 1 mg via INTRAVENOUS

## 2018-01-08 NOTE — Procedures (Signed)
Interventional Radiology Procedure Note  Procedure: CT guided bone marrow aspiration and biopsy  Complications: None  EBL: < 10 mL  Findings: Aspirate and core biopsy performed of bone marrow in right iliac bone.  Plan: Bedrest supine x 1 hrs  Adriana Quinby T. Mikeyla Music, M.D Pager:  [phone]   

## 2018-01-08 NOTE — H&P (Signed)
Chief Complaint: Patient was seen in consultation today for bone marrow biopsy at the request of Yu,Zhou  Referring Physician(s): Yu,Zhou  Patient Status: [facility] - Out-pt  History of Present Illness: Alan Bennett is a 72 y.o. male with persistent leukocytosis, predominantly neutrophilia and monocytosis.  Mild anemia.  Here for CT guided bone marrow aspirate and biopsy for further work up.  Currently asymptomatic. History of atrial fibrillation.  Past Medical History:  Diagnosis Date  . Coronary artery disease   . Hypertension   . Renal disorder   . Thyroid disease     Past Surgical History:  Procedure Laterality Date  . COLONOSCOPY WITH PROPOFOL N/A 11/25/2017   Procedure: COLONOSCOPY WITH PROPOFOL;  Surgeon: Jonathon Bellows, MD;  Location: [facility] ENDOSCOPY;  Service: Gastroenterology;  Laterality: N/A;  . COLONOSCOPY WITH PROPOFOL N/A 12/07/2017   Procedure: COLONOSCOPY WITH PROPOFOL;  Surgeon: Lin Landsman, MD;  Location: [facility] ENDOSCOPY;  Service: Gastroenterology;  Laterality: N/A;  . ESOPHAGOGASTRODUODENOSCOPY (EGD) WITH PROPOFOL N/A 11/25/2017   Procedure: ESOPHAGOGASTRODUODENOSCOPY (EGD) WITH PROPOFOL;  Surgeon: Jonathon Bellows, MD;  Location: [facility] ENDOSCOPY;  Service: Gastroenterology;  Laterality: N/A;    Allergies: Patient has no known allergies.  Medications: Prior to Admission medications   Medication Sig Start Date End Date Taking? Authorizing Provider  amiodarone (PACERONE) 200 MG tablet Take 200 mg by mouth daily.  03/21/17  Yes [provider]  atorvastatin (LIPITOR) 40 MG tablet Take 40 mg by mouth every evening.  04/28/17  Yes [provider]  Docusate Calcium (STOOL SOFTENER PO) Take by mouth.   Yes [provider]  isosorbide mononitrate (IMDUR) 30 MG 24 hr tablet Take 30 mg by mouth daily.  04/28/17  Yes [provider]  levothyroxine (SYNTHROID, LEVOTHROID) 75 MCG tablet Take 75 mcg by mouth daily.  04/08/17  Yes [provider]  lisinopril (PRINIVIL,ZESTRIL) 5 MG tablet Take 5 mg by mouth daily.  04/28/17  Yes [provider]  Multiple Vitamin (MULTIVITAMIN) tablet Take 1 tablet by mouth daily.   Yes [provider]  Omega-3 Fatty Acids (FISH OIL) 1000 MG CAPS Take 1,000 mg by mouth daily.   Yes [provider]  omeprazole (PRILOSEC) 40 MG capsule Take 1 capsule (40 mg total) by mouth daily. 11/25/17 11/25/18 Yes Jonathon Bellows, MD  Psyllium (METAMUCIL FIBER PO) Take by mouth.   Yes [provider]  folic acid (FOLVITE) 1 MG tablet Take 1 mg by mouth daily.    [provider]  mirtazapine (REMERON) 15 MG tablet Take half to one tab at night for sleep and appetite Patient not taking: Reported on 01/08/2018 01/01/18   Lavera Guise, MD  ondansetron (ZOFRAN) 4 MG tablet TAKE 1 TABLET BY MOUTH EVERY 12 HOURS AS NEEDED FOR NAUSEA AND VOMITING Patient not taking: Reported on 01/08/2018 12/17/17   Earlie Server, MD     Family History  Problem Relation Age of Onset  . Pneumonia Father   . Heart attack Brother     Social History   Socioeconomic History  . Marital status: Married    Spouse name: None  . Number of children: None  . Years of education: None  . Highest education level: None  Social Needs  . Financial resource strain: None  . Food insecurity - worry: None  . Food insecurity - inability: None  . Transportation needs - medical: None  . Transportation needs - non-medical: None  Occupational History  . None  Tobacco Use  .  Smoking status: Former Smoker    Years: 20.00  . Smokeless tobacco: Never Used  . Tobacco comment: 10 years ago  Substance and Sexual Activity  . Alcohol use: No    Frequency: Never  . Drug use: No  . Sexual activity: Yes  Other Topics Concern  . None  Social History Narrative  . None    ECOG Status: 0 - Asymptomatic  Review of Systems: A 12 point ROS discussed and pertinent positives are indicated in the HPI above.  All other  systems are negative.  Review of Systems  Constitutional: Negative.   HENT: Negative.   Respiratory: Negative.   Cardiovascular: Negative.   Gastrointestinal: Negative.   Genitourinary: Negative.   Musculoskeletal: Negative.   Skin:       History of severe chronic dry skin since childhood.  Neurological: Negative.     Vital Signs: BP (!) 159/59   Pulse (!) 57   Temp (!) 96.3 F (35.7 C) (Axillary)   Resp 17   Ht _0  (1.854 m)   Wt 155 lb (70.3 kg)   SpO2 99%   BMI 20.45 kg/m   Physical Exam  Constitutional: He is oriented to person, place, and time. No distress.  HENT:  Head: Normocephalic and atraumatic.  Neck: Neck supple. No JVD present.  Cardiovascular: Normal rate, regular rhythm and normal heart sounds. Exam reveals no gallop and no friction rub.  No murmur heard. Pulmonary/Chest: Effort normal and breath sounds normal. No stridor. No respiratory distress. He has no wheezes. He has no rales.  Abdominal: Soft. Bowel sounds are normal. He exhibits no distension and no mass. There is no tenderness. There is no rebound and no guarding.  Musculoskeletal: He exhibits no edema.  Lymphadenopathy:    He has no cervical adenopathy.  Neurological: He is alert and oriented to person, place, and time.  Skin: Skin is warm and dry. He is not diaphoretic.  Vitals reviewed.   Imaging: No results found.  Labs:  CBC: Recent Labs    12/07/17 0557 12/12/17 0948 12/26/17 0808 01/08/18 0748  WBC 14.6* 15.2* 18.4* 17.3*  HGB 11.3* 11.1* 11.7* 11.1*  HCT 34.1* 33.2* 35.7* 33.9*  PLT 193 216 201 188    COAGS: Recent Labs    12/05/17 1027 12/30/17 1129  INR 1.07 1.1  APTT 36  --     BMP: Recent Labs    10/24/17 0919 12/05/17 1027 12/06/17 0519 12/12/17 0948  NA 139 140 142 140  K 4.2 4.3 4.3 4.4  CL 103 109 112* 109  CO2 28 21* 23 26  GLUCOSE 96 120* 85 136*  BUN 21* 26* 18 22*  CALCIUM 7.5* 8.0* 7.8* 7.6*  CREATININE 2.07* 1.75* 1.51* 1.70*    GFRNONAA 30* 37* 44* 38*  GFRAA 35* 43* 51* 45*    LIVER FUNCTION TESTS: Recent Labs    10/15/17 1628 10/24/17 0919 12/05/17 1027  BILITOT 0.9 1.0 0.8  AST 111* 76* 113*  ALT 67* 49 53  ALKPHOS 149* 138* 234*  PROT 7.7 6.5 6.2*  ALBUMIN 3.2* 2.8* 2.5*    Assessment and Plan:  For bone marrow biopsy today. Risks and benefits discussed with the patient including, but not limited to bleeding, infection, damage to adjacent structures or low yield requiring additional tests.  All of the patient's questions were answered, patient is agreeable to proceed.  Consent signed and in chart. Not in atrial fib today; sinus bradycardia.  Thank you for this interesting consult.  I greatly enjoyed meeting Alan Bennett and look forward to participating in their care.  A copy of this report was sent to the requesting provider on this date.  Electronically Signed: Azzie Roup, MD 01/08/2018, 8:24 AM   I spent a total of  30 Minutes  in face to face in clinical consultation, greater than 50% of which was counseling/coordinating care for bone marrow biopsy.

## 2018-01-08 NOTE — Discharge Instructions (Signed)
Moderate Conscious Sedation, Adult, Care After These instructions provide you with information about caring for yourself after your procedure. Your health care provider may also give you more specific instructions. Your treatment has been planned according to current medical practices, but problems sometimes occur. Call your health care provider if you have any problems or questions after your procedure. What can I expect after the procedure? After your procedure, it is common:  To feel sleepy for several hours.  To feel clumsy and have poor balance for several hours.  To have poor judgment for several hours.  To vomit if you eat too soon.  Follow these instructions at home: For at least 24 hours after the procedure:   Do not: ? Participate in activities where you could fall or become injured. ? Drive. ? Use heavy machinery. ? Drink alcohol. ? Take sleeping pills or medicines that cause drowsiness. ? Make important decisions or sign legal documents. ? Take care of children on your own.  Rest. Eating and drinking  Follow the diet recommended by your health care provider.  If you vomit: ? Drink water, juice, or soup when you can drink without vomiting. ? Make sure you have little or no nausea before eating solid foods. General instructions  Have a responsible adult stay with you until you are awake and alert.  Take over-the-counter and prescription medicines only as told by your health care provider.  If you smoke, do not smoke without supervision.  Keep all follow-up visits as told by your health care provider. This is important. Contact a health care provider if:  You keep feeling nauseous or you keep vomiting.  You feel light-headed.  You develop a rash.  You have a fever. Get help right away if:  You have trouble breathing. This information is not intended to replace advice given to you by your health care provider. Make sure you discuss any questions you have  with your health care provider. Document Released: 08/12/2013 Document Revised: 03/26/2016 Document Reviewed: 02/11/2016 Elsevier Interactive Patient Education  2018 [place]. Bone Marrow Aspiration and Bone Marrow Biopsy, Adult, Care After This sheet gives you information about how to care for yourself after your procedure. Your health care provider may also give you more specific instructions. If you have problems or questions, contact your health care provider. What can I expect after the procedure? After the procedure, it is common to have:  Mild pain and tenderness.  Swelling.  Bruising.  Follow these instructions at home:  Take over-the-counter or prescription medicines only as told by your health care provider.  Do not take baths, swim, or use a hot tub until your health care provider approves. Ask if you can take a shower or have a sponge bath.  Follow instructions from your health care provider about how to take care of the puncture site. Make sure you: ? Wash your hands with soap and water before you change your bandage (dressing). If soap and water are not available, use hand sanitizer. ? Change your dressing as told by your health care provider.  Check your puncture siteevery day for signs of infection. Check for: ? More redness, swelling, or pain. ? More fluid or blood. ? Warmth. ? Pus or a bad smell.  Return to your normal activities as told by your health care provider. Ask your health care provider what activities are safe for you.  Do not drive for 24 hours if you were given a medicine to help you relax (sedative).  Keep all follow-up visits as told by your health care provider. This is important. °Contact a health care provider if: °· You have more redness, swelling, or pain around the puncture site. °· You have more fluid or blood coming from the puncture site. °· Your puncture site feels warm to the touch. °· You have pus or a bad smell coming from the  puncture site. °· You have a fever. °· Your pain is not controlled with medicine. °This information is not intended to replace advice given to you by your health care provider. Make sure you discuss any questions you have with your health care provider. °Document Released: 05/11/2005 Document Revised: 05/11/2016 Document Reviewed: 04/04/2016 °Elsevier Interactive Patient Education © 2018 [employer] ° °

## 2018-01-09 ENCOUNTER — Ambulatory Visit: Payer: Medicare Other

## 2018-01-16 ENCOUNTER — Encounter (HOSPITAL_COMMUNITY): Payer: Self-pay | Admitting: Oncology

## 2018-01-16 ENCOUNTER — Inpatient Hospital Stay: Payer: Medicare Other | Attending: Oncology | Admitting: Oncology

## 2018-01-16 ENCOUNTER — Encounter: Payer: Self-pay | Admitting: Nurse Practitioner

## 2018-01-16 VITALS — BP 172/71 | HR 59 | Temp 97.5°F | Wt 157.1 lb

## 2018-01-16 DIAGNOSIS — D729 Disorder of white blood cells, unspecified: Secondary | ICD-10-CM

## 2018-01-16 DIAGNOSIS — Z79899 Other long term (current) drug therapy: Secondary | ICD-10-CM | POA: Diagnosis not present

## 2018-01-16 DIAGNOSIS — D649 Anemia, unspecified: Secondary | ICD-10-CM

## 2018-01-16 DIAGNOSIS — D472 Monoclonal gammopathy: Secondary | ICD-10-CM | POA: Diagnosis not present

## 2018-01-16 DIAGNOSIS — Z7901 Long term (current) use of anticoagulants: Secondary | ICD-10-CM | POA: Diagnosis not present

## 2018-01-16 DIAGNOSIS — E538 Deficiency of other specified B group vitamins: Secondary | ICD-10-CM | POA: Diagnosis not present

## 2018-01-16 DIAGNOSIS — R634 Abnormal weight loss: Secondary | ICD-10-CM

## 2018-01-16 DIAGNOSIS — D72821 Monocytosis (symptomatic): Secondary | ICD-10-CM | POA: Diagnosis not present

## 2018-01-16 DIAGNOSIS — E039 Hypothyroidism, unspecified: Secondary | ICD-10-CM | POA: Diagnosis not present

## 2018-01-16 DIAGNOSIS — I4891 Unspecified atrial fibrillation: Secondary | ICD-10-CM | POA: Diagnosis not present

## 2018-01-16 DIAGNOSIS — Z87891 Personal history of nicotine dependence: Secondary | ICD-10-CM | POA: Diagnosis not present

## 2018-01-16 LAB — CHROMOSOME ANALYSIS, BONE MARROW

## 2018-01-16 NOTE — Progress Notes (Signed)
Hematology/Oncology Follow up note [facility] Telephone:([phone] Fax:(336) [phone]   Patient Care Team: Lavera Guise, MD as PCP - General (Internal Medicine)  REFERRING PROVIDER: Merlyn Lot CHIEF COMPLAINTS/PURPOSE OF CONSULTATION:  Leukocytosis  HISTORY OF PRESENTING ILLNESS:  Alan Bennett is a  72 y.o.  male with PMH listed below who was referred to me for evaluation of leukocytosis.  Patient presented to ER for evaluation of 3 months weight loss, nausea and vomiting. He had blood work done which showed leukocytosis, predominantly neutrophilia and monocytosis.   Patient reports 4-6 weeks of feeling food stuck in the throat which triggers vomiting. He also has had a 15 pound weight loss over the 3 months. Denies any cough, shortness of breath. He is a former smoker, quit about 10 years ago. Denies any alcohol use.   # He has history of atrial fibrillation.# He has atrial fibrillation and is supposed to be on Eliquis for anticoagulation.  He has not started on taking it because [facility].  He will have a follow-up appointment with his cardiologist and decide.  Currently not on Eliquis.  # # Patient was started on folic acid supplementation for treatment of folate deficiency. Folate level normalized.   # 11/25/2017 He had EGD/Colonoscopy  Findings includes nonbleeding internal hemorrhoids, multiple polyps in the colon which were removed and resected.  EGD findings include mild Schatzki ring, gastritis and a single duodenal polyp which was biopsied. Pathology revealed duodenal tubular adenoma, negative for high-grade dysplasia and malignancy. Stomach biopsy negative for active inflammation dyspepsia and malignancy.  Negative for H. Pylori. Multiple tubular adenoma throughout cecum, transverse colon and descending colon.  Negative for high-grade dysplasia and malignancy. Rectum hyperplastic polyp  negative for dysplasia and malignancy.  Small fragment of tubular adenoma.    # During the interval, 12/05/2017, patient presented to ER due to dark stool in the rectal bleeding for the past 5 days.  Hemoglobin dropped, considered to be secondary to lower GI bleed from polypectomy.Patient was evaluated by GI, and he had a repeat colonoscopy which demonstrated a few more polyps in the colon.  Polyps were resected and retrieved.  The pathology showed tubular adenoma/hyperplastic polyps, negative for high-grade dysplasia and malignancy. Patient is to need to follow-up with Dr. Vicente Males for duodenal adenoma removal..  #Patient also has had a chest CT chest done during admission.  There is no acute findings in the thorax.  Mild increase of abdominal adenopathy compared to scan that was done in 2018 December, most notable in the portal caval space.    INTERVAL HISTORY Alan Bennett is a 72 y.o. male who has above history reviewed by me today presents for follow up visit for management of leukocytosis, weight loss, nausea. He feels appetite is slightly better, . He feels appetite is a little bit better. He worries about his bone marrow biopsy results. No new complaints.  Review of Systems  Constitutional: Positive for malaise/fatigue. Negative for chills, fever and weight loss.  HENT: Negative for ear pain, hearing loss and nosebleeds.   Eyes: Negative for blurred vision, photophobia and pain.  Respiratory: Negative for cough, sputum production and shortness of breath.   Cardiovascular: Negative for chest pain, orthopnea and claudication.  Gastrointestinal: Negative for abdominal pain, blood in stool, heartburn, nausea and vomiting.  Genitourinary: Negative for dysuria and frequency.  Musculoskeletal: Negative for myalgias and neck pain.  Skin: Negative for itching and  rash.  Neurological: Negative for dizziness, tremors, speech change and weakness.  Endo/Heme/Allergies: Does not bruise/bleed easily.   Psychiatric/Behavioral: Negative for depression and substance abuse. The patient is not nervous/anxious.     MEDICAL HISTORY:  Past Medical History:  Diagnosis Date  . Coronary artery disease   . Hypertension   . Renal disorder   . Thyroid disease     SURGICAL HISTORY: Past Surgical History:  Procedure Laterality Date  . COLONOSCOPY WITH PROPOFOL N/A 11/25/2017   Procedure: COLONOSCOPY WITH PROPOFOL;  Surgeon: Jonathon Bellows, MD;  Location: [facility] ENDOSCOPY;  Service: Gastroenterology;  Laterality: N/A;  . COLONOSCOPY WITH PROPOFOL N/A 12/07/2017   Procedure: COLONOSCOPY WITH PROPOFOL;  Surgeon: Lin Landsman, MD;  Location: [facility] ENDOSCOPY;  Service: Gastroenterology;  Laterality: N/A;  . ESOPHAGOGASTRODUODENOSCOPY (EGD) WITH PROPOFOL N/A 11/25/2017   Procedure: ESOPHAGOGASTRODUODENOSCOPY (EGD) WITH PROPOFOL;  Surgeon: Jonathon Bellows, MD;  Location: [facility] ENDOSCOPY;  Service: Gastroenterology;  Laterality: N/A;    SOCIAL HISTORY: Social History   Socioeconomic History  . Marital status: Married    Spouse name: Not on file  . Number of children: Not on file  . Years of education: Not on file  . Highest education level: Not on file  Social Needs  . Financial resource strain: Not on file  . Food insecurity - worry: Not on file  . Food insecurity - inability: Not on file  . Transportation needs - medical: Not on file  . Transportation needs - non-medical: Not on file  Occupational History  . Not on file  Tobacco Use  . Smoking status: Former Smoker    Years: 20.00  . Smokeless tobacco: Never Used  . Tobacco comment: 10 years ago  Substance and Sexual Activity  . Alcohol use: No    Frequency: Never  . Drug use: No  . Sexual activity: Yes  Other Topics Concern  . Not on file  Social History Narrative  . Not on file    FAMILY HISTORY: Family History  Problem Relation Age of Onset  . Pneumonia Father   . Heart attack Brother     ALLERGIES:  has No Known  Allergies.  MEDICATIONS:  Current Outpatient Medications  Medication Sig Dispense Refill  . amiodarone (PACERONE) 200 MG tablet Take 200 mg by mouth daily.     Marland Kitchen atorvastatin (LIPITOR) 40 MG tablet Take 40 mg by mouth every evening.     Mariane Baumgarten Calcium (STOOL SOFTENER PO) Take by mouth.    . folic acid (FOLVITE) 1 MG tablet Take 1 mg by mouth daily.    . isosorbide mononitrate (IMDUR) 30 MG 24 hr tablet Take 30 mg by mouth daily.     Marland Kitchen levothyroxine (SYNTHROID, LEVOTHROID) 75 MCG tablet Take 75 mcg by mouth daily.     Marland Kitchen lisinopril (PRINIVIL,ZESTRIL) 5 MG tablet Take 5 mg by mouth daily.     . mirtazapine (REMERON) 15 MG tablet Take half to one tab at night for sleep and appetite (Patient not taking: Reported on 01/08/2018) 30 tablet 12  . Multiple Vitamin (MULTIVITAMIN) tablet Take 1 tablet by mouth daily.    . Omega-3 Fatty Acids (FISH OIL) 1000 MG CAPS Take 1,000 mg by mouth daily.    Marland Kitchen omeprazole (PRILOSEC) 40 MG capsule Take 1 capsule (40 mg total) by mouth daily. 30 capsule 1  . ondansetron (ZOFRAN) 4 MG tablet TAKE 1 TABLET BY MOUTH EVERY 12 HOURS AS NEEDED FOR NAUSEA AND VOMITING 60 tablet 0  . Psyllium (METAMUCIL  FIBER PO) Take by mouth.     No current facility-administered medications for this visit.      PHYSICAL EXAMINATION: ECOG PERFORMANCE STATUS: 1 - Symptomatic but completely ambulatory Vitals:   01/17/18 0848  BP: (!) 172/71  Pulse: (!) 59  Temp: (!) 97.5 F (36.4 C)   Filed Weights   01/17/18 0848  Weight: 157 lb 1.2 oz (71.2 kg)    Physical Exam  Constitutional: He is oriented to person, place, and time. No distress.  Thin.   HENT:  Head: Normocephalic and atraumatic.  Mouth/Throat: Oropharynx is clear and moist. No oropharyngeal exudate.  Eyes: EOM are normal. Pupils are equal, round, and reactive to light. Right eye exhibits no discharge. No scleral icterus.  Neck: Normal range of motion. Neck supple.  Cardiovascular: Normal heart sounds.  No murmur  heard. Irregular heart beats  Pulmonary/Chest: Effort normal and breath sounds normal. No respiratory distress. He has no wheezes. He has no rales.  Abdominal: Soft. Bowel sounds are normal. He exhibits no distension and no mass. There is no tenderness. There is no rebound.  Musculoskeletal: Normal range of motion. He exhibits no edema or tenderness.  Lymphadenopathy:    He has no cervical adenopathy.  Neurological: He is alert and oriented to person, place, and time. He displays normal reflexes. No cranial nerve deficit.  Skin: Skin is warm. He is not diaphoretic. No erythema.  Psychiatric: Affect and judgment normal.     LABORATORY DATA:  I have reviewed the data as listed Lab Results  Component Value Date   WBC 17.3 (H) 01/08/2018   HGB 11.1 (L) 01/08/2018   HCT 33.9 (L) 01/08/2018   MCV 89.1 01/08/2018   PLT 188 01/08/2018   Recent Labs    10/15/17 1628 10/24/17 0919 12/05/17 1027 12/06/17 0519 12/12/17 0948  NA 139 139 140 142 140  K 4.5 4.2 4.3 4.3 4.4  CL 103 103 109 112* 109  CO2 25 28 21* 23 26  GLUCOSE 151* 96 120* 85 136*  BUN 26* 21* 26* 18 22*  CREATININE 2.58* 2.07* 1.75* 1.51* 1.70*  CALCIUM 8.5* 7.5* 8.0* 7.8* 7.6*  GFRNONAA 23* 30* 37* 44* 38*  GFRAA 27* 35* 43* 51* 45*  PROT 7.7 6.5 6.2*  --   --   ALBUMIN 3.2* 2.8* 2.5*  --   --   AST 111* 76* 113*  --   --   ALT 67* 49 53  --   --   ALKPHOS 149* 138* 234*  --   --   BILITOT 0.9 1.0 0.8  --   --    Negative hepatitis panel LDH 157 Negative flowcytometry Negative BCR ABL UA showed proteinuria #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   SPEP showed 0.3g/dl polyclonal IgA with both Kappa and Lamda increased. Normal free light chain ratio. UPEP negative.   01/08/2018 Bone marrow Biopsy Bone Marrow, Aspirate,Biopsy, and Clot, right iliac BONE MARROW: - MILDLY HYPERCELLULAR MARROW WITH TRILINEAGE HEMATOPOIESIS AND MATURATION - NO MORPHOLOGIC OR IMMUNOPHENOTYPIC  EVIDENCE OF LEUKEMIA, LYMPHOMA OR MYELODYSPLASTIC SYNDROME - SEE MICROSCOPIC DESCRIPTION BELOW PERIPHERAL BLOOD: - MILD NORMOCYTIC ANEMIA - SEE COMPLETE BLOOD COUNT  Normal Cytogenetics.   ASSESSMENT & PLAN:  1. Anemia, unspecified type   2. Neutrophilia   3. Weight loss   4. Monocytosis   5. MGUS (monoclonal gammopathy of unknown significance)    Persistent Leukocytosis predominantly neutrophilia and monocytosis, trending up again. #Jak 2 mutation with  reflex to CALR + JAK2 exon 12-15, 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  BCR ABL negative.  Bone marrow is negative, cytogenetics normal.    #Hypo-thyroidism: check TSH at next visit.   #  weight loss; etiology unknown. Stable weight lately.    All questions were answered. The patient knows to call the clinic with any problems questions or concerns. Return of visit:  4 weeks with repeat labs.   Earlie Server, MD, PhD Hematology Oncology [facility] Pager- [phone] 01/16/2018

## 2018-01-18 ENCOUNTER — Other Ambulatory Visit: Payer: Self-pay | Admitting: Gastroenterology

## 2018-01-19 ENCOUNTER — Encounter: Payer: Self-pay | Admitting: Oncology

## 2018-01-23 ENCOUNTER — Ambulatory Visit: Payer: Medicare Other | Admitting: Oncology

## 2018-01-23 ENCOUNTER — Ambulatory Visit: Payer: Medicare Other

## 2018-01-31 DIAGNOSIS — M170 Bilateral primary osteoarthritis of knee: Secondary | ICD-10-CM | POA: Diagnosis not present

## 2018-02-13 ENCOUNTER — Inpatient Hospital Stay: Payer: Medicare Other | Attending: Oncology | Admitting: Oncology

## 2018-02-13 ENCOUNTER — Encounter: Payer: Self-pay | Admitting: Oncology

## 2018-02-13 ENCOUNTER — Inpatient Hospital Stay: Payer: Medicare Other

## 2018-02-13 VITALS — BP 157/65 | HR 55 | Temp 97.7°F | Wt 151.6 lb

## 2018-02-13 DIAGNOSIS — R591 Generalized enlarged lymph nodes: Secondary | ICD-10-CM

## 2018-02-13 DIAGNOSIS — D649 Anemia, unspecified: Secondary | ICD-10-CM

## 2018-02-13 DIAGNOSIS — R634 Abnormal weight loss: Secondary | ICD-10-CM | POA: Diagnosis not present

## 2018-02-13 DIAGNOSIS — D729 Disorder of white blood cells, unspecified: Secondary | ICD-10-CM

## 2018-02-13 DIAGNOSIS — E538 Deficiency of other specified B group vitamins: Secondary | ICD-10-CM | POA: Diagnosis not present

## 2018-02-13 DIAGNOSIS — K7460 Unspecified cirrhosis of liver: Secondary | ICD-10-CM | POA: Insufficient documentation

## 2018-02-13 DIAGNOSIS — Z79899 Other long term (current) drug therapy: Secondary | ICD-10-CM | POA: Diagnosis not present

## 2018-02-13 DIAGNOSIS — Z87891 Personal history of nicotine dependence: Secondary | ICD-10-CM | POA: Insufficient documentation

## 2018-02-13 DIAGNOSIS — R188 Other ascites: Secondary | ICD-10-CM | POA: Diagnosis not present

## 2018-02-13 DIAGNOSIS — I4891 Unspecified atrial fibrillation: Secondary | ICD-10-CM | POA: Insufficient documentation

## 2018-02-13 DIAGNOSIS — D72821 Monocytosis (symptomatic): Secondary | ICD-10-CM

## 2018-02-13 DIAGNOSIS — E039 Hypothyroidism, unspecified: Secondary | ICD-10-CM

## 2018-02-13 DIAGNOSIS — R946 Abnormal results of thyroid function studies: Secondary | ICD-10-CM | POA: Diagnosis not present

## 2018-02-13 DIAGNOSIS — N184 Chronic kidney disease, stage 4 (severe): Secondary | ICD-10-CM | POA: Diagnosis not present

## 2018-02-13 DIAGNOSIS — R599 Enlarged lymph nodes, unspecified: Secondary | ICD-10-CM

## 2018-02-13 DIAGNOSIS — D472 Monoclonal gammopathy: Secondary | ICD-10-CM | POA: Diagnosis not present

## 2018-02-13 DIAGNOSIS — D72829 Elevated white blood cell count, unspecified: Secondary | ICD-10-CM

## 2018-02-13 DIAGNOSIS — R590 Localized enlarged lymph nodes: Secondary | ICD-10-CM

## 2018-02-13 LAB — COMPREHENSIVE METABOLIC PANEL
ALK PHOS: 268 U/L — AB (ref 38–126)
ALT: 45 U/L (ref 17–63)
ANION GAP: 8 (ref 5–15)
AST: 84 U/L — ABNORMAL HIGH (ref 15–41)
Albumin: 2.5 g/dL — ABNORMAL LOW (ref 3.5–5.0)
BUN: 31 mg/dL — ABNORMAL HIGH (ref 6–20)
CALCIUM: 8.3 mg/dL — AB (ref 8.9–10.3)
CHLORIDE: 112 mmol/L — AB (ref 101–111)
CO2: 22 mmol/L (ref 22–32)
Creatinine, Ser: 1.98 mg/dL — ABNORMAL HIGH (ref 0.61–1.24)
GFR calc Af Amer: 37 mL/min — ABNORMAL LOW (ref >60)
GFR calc non Af Amer: 32 mL/min — ABNORMAL LOW (ref >60)
GLUCOSE: 154 mg/dL — AB (ref 65–99)
Potassium: 4.9 mmol/L (ref 3.5–5.1)
SODIUM: 142 mmol/L (ref 135–145)
Total Bilirubin: 0.9 mg/dL (ref 0.3–1.2)
Total Protein: 6.6 g/dL (ref 6.5–8.1)

## 2018-02-13 LAB — CBC WITH DIFFERENTIAL/PLATELET
Basophils Absolute: 0.1 10*3/uL (ref 0–0.1)
Basophils Relative: 1 %
EOS ABS: 0.2 10*3/uL (ref 0–0.7)
Eosinophils Relative: 1 %
HCT: 34.6 % — ABNORMAL LOW (ref 40.0–52.0)
HEMOGLOBIN: 11.2 g/dL — AB (ref 13.0–18.0)
LYMPHS ABS: 1.4 10*3/uL (ref 1.0–3.6)
LYMPHS PCT: 8 %
MCH: 27.8 pg (ref 26.0–34.0)
MCHC: 32.4 g/dL (ref 32.0–36.0)
MCV: 85.8 fL (ref 80.0–100.0)
Monocytes Absolute: 1.1 10*3/uL — ABNORMAL HIGH (ref 0.2–1.0)
Monocytes Relative: 6 %
NEUTROS PCT: 84 %
Neutro Abs: 14.1 10*3/uL — ABNORMAL HIGH (ref 1.4–6.5)
Platelets: 185 10*3/uL (ref 150–440)
RBC: 4.04 MIL/uL — ABNORMAL LOW (ref 4.40–5.90)
RDW: 15.4 % — ABNORMAL HIGH (ref 11.5–14.5)
WBC: 17 10*3/uL — ABNORMAL HIGH (ref 3.8–10.6)

## 2018-02-13 LAB — TSH: TSH: 12.509 u[IU]/mL — ABNORMAL HIGH (ref 0.350–4.500)

## 2018-02-13 NOTE — Progress Notes (Signed)
Patient here today for follow up.   

## 2018-02-13 NOTE — Progress Notes (Signed)
Hematology/Oncology Follow up note [facility] Telephone:(336) [phone] Fax:(336) [phone]   Patient Care Team: Lavera Guise, MD as PCP - General (Internal Medicine)  REFERRING PROVIDER: Merlyn Lot REASON FOR VISIT Follow up for treatment of leukocytosis and neutrophilia, weight loss  HISTORY OF PRESENTING ILLNESS:  Alan Bennett is a  72 y.o.  male with PMH listed below who was referred to me for evaluation of leukocytosis.  Patient presented to ER for evaluation of 3 months weight loss, nausea and vomiting. He had blood work done which showed leukocytosis, predominantly neutrophilia and monocytosis.   Patient reports 4-6 weeks of feeling food stuck in the throat which triggers vomiting. He also has had a 15 pound weight loss over the 3 months. Denies any cough, shortness of breath. He is a former smoker, quit about 10 years ago. Denies any alcohol use.   # He has history of atrial fibrillation.# He has atrial fibrillation and is supposed to be on Eliquis for anticoagulation.  He has not started on taking it because [facility].  He will have a follow-up appointment with his cardiologist and decide.  Currently not on Eliquis.  # # Patient was started on folic acid supplementation for treatment of folate deficiency. Folate level normalized.   # 11/25/2017 He had EGD/Colonoscopy  Findings includes nonbleeding internal hemorrhoids, multiple polyps in the colon which were removed and resected.  EGD findings include mild Schatzki ring, gastritis and a single duodenal polyp which was biopsied. Pathology revealed duodenal tubular adenoma, negative for high-grade dysplasia and malignancy. Stomach biopsy negative for active inflammation dyspepsia and malignancy.  Negative for H. Pylori. Multiple tubular adenoma throughout cecum, transverse colon and descending colon.  Negative for high-grade dysplasia and  malignancy. Rectum hyperplastic polyp negative for dysplasia and malignancy.  Small fragment of tubular adenoma.    # During the interval, 12/05/2017, patient presented to ER due to dark stool in the rectal bleeding for the past 5 days.  Hemoglobin dropped, considered to be secondary to lower GI bleed from polypectomy.Patient was evaluated by GI, and he had a repeat colonoscopy which demonstrated a few more polyps in the colon.  Polyps were resected and retrieved.  The pathology showed tubular adenoma/hyperplastic polyps, negative for high-grade dysplasia and malignancy. Patient is to need to follow-up with Dr. Vicente Males for duodenal adenoma removal..  #Patient also has had a chest CT chest done during admission.  There is no acute findings in the thorax.  Mild increase of abdominal adenopathy compared to scan that was done in 2018 December, most notable in the portal caval space.  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   SPEP showed 0.3g/dl polyclonal IgA with both Kappa and Lamda increased. Normal free light chain ratio. UPEP negative.   10/15/2017 CT abdomen pelvis showed 2.2cm liver cyst otherwise normal liver, no splenomegaly.  12/05/2017 CT chest w contrast showed Mild increase in abdominal adenopathy when compared with December of 2018. This is most notable in the portacaval space as described. Heterogeneity and nodularity within the liver consistent with underlying cirrhotic change. This is also more marked when compared with the prior exam  01/08/2018 Bone marrow Biopsy Bone Marrow, Aspirate,Biopsy, and Clot, right iliac BONE MARROW: - MILDLY HYPERCELLULAR MARROW WITH TRILINEAGE HEMATOPOIESIS AND MATURATION -  NO MORPHOLOGIC OR IMMUNOPHENOTYPIC EVIDENCE OF LEUKEMIA, LYMPHOMA OR MYELODYSPLASTIC SYNDROME -  SEE MICROSCOPIC DESCRIPTION BELOW PERIPHERAL BLOOD: - MILD NORMOCYTIC ANEMIA - SEE COMPLETE BLOOD COUNT # Normal cytogenetics.    INTERVAL HISTORY [name] is a 72 y.o. male who has above history reviewed by me today presents for follow up visit for management of leukocytosis, weight loss, nausea. He feels appetite is slightly better, continue to have decreased appetite and has lost 6 pounds since last visit.  He feels food taste well however after 3-4 bites, he feels full. Wife accompanied patient to today's visit.  Denies fever chills nausea vomiting.  Review of Systems  Constitutional: Positive for malaise/fatigue and weight loss. Negative for chills and fever.  HENT: Negative for ear pain, hearing loss and nosebleeds.   Eyes: Negative for blurred vision, photophobia, pain and discharge.  Respiratory: Negative for cough, sputum production and shortness of breath.   Cardiovascular: Negative for chest pain, orthopnea and claudication.  Gastrointestinal: Negative for abdominal pain, blood in stool, heartburn, nausea and vomiting.  Genitourinary: Negative for dysuria and frequency.  Musculoskeletal: Negative for myalgias and neck pain.  Skin: Negative for itching and rash.  Neurological: Negative for dizziness, tremors, speech change and weakness.  Endo/Heme/Allergies: Does not bruise/bleed easily.  Psychiatric/Behavioral: Negative for depression and substance abuse. The patient is not nervous/anxious.     MEDICAL HISTORY:  Past Medical History:  Diagnosis Date  . Coronary artery disease   . Hypertension   . Renal disorder   . Thyroid disease     SURGICAL HISTORY: Past Surgical History:  Procedure Laterality Date  . COLONOSCOPY WITH PROPOFOL N/A 11/25/2017   Procedure: COLONOSCOPY WITH PROPOFOL;  Surgeon: Jonathon Bellows, MD;  Location: [facility] ENDOSCOPY;  Service: Gastroenterology;  Laterality: N/A;  . COLONOSCOPY WITH PROPOFOL N/A 12/07/2017   Procedure: COLONOSCOPY WITH  PROPOFOL;  Surgeon: Lin Landsman, MD;  Location: [facility] ENDOSCOPY;  Service: Gastroenterology;  Laterality: N/A;  . ESOPHAGOGASTRODUODENOSCOPY (EGD) WITH PROPOFOL N/A 11/25/2017   Procedure: ESOPHAGOGASTRODUODENOSCOPY (EGD) WITH PROPOFOL;  Surgeon: Jonathon Bellows, MD;  Location: [facility] ENDOSCOPY;  Service: Gastroenterology;  Laterality: N/A;    SOCIAL HISTORY: Social History   Socioeconomic History  . Marital status: Married    Spouse name: Not on file  . Number of children: Not on file  . Years of education: Not on file  . Highest education level: Not on file  Occupational History  . Not on file  Social Needs  . Financial resource strain: Not on file  . Food insecurity:    Worry: Not on file    Inability: Not on file  . Transportation needs:    Medical: Not on file    Non-medical: Not on file  Tobacco Use  . Smoking status: Former Smoker    Years: 20.00  . Smokeless tobacco: Never Used  . Tobacco comment: 10 years ago  Substance and Sexual Activity  . Alcohol use: No    Frequency: Never  . Drug use: No  . Sexual activity: Yes  Lifestyle  . Physical activity:    Days per week: Not on file    Minutes per session: Not on file  . Stress: Not on file  Relationships  . Social connections:    Talks on phone: Not on file    Gets together: Not on file    Attends religious service: Not on file    Active member of club or organization: Not on file    Attends meetings of clubs  or organizations: Not on file    Relationship status: Not on file  . Intimate partner violence:    Fear of current or ex partner: Not on file    Emotionally abused: Not on file    Physically abused: Not on file    Forced sexual activity: Not on file  Other Topics Concern  . Not on file  Social History Narrative  . Not on file    FAMILY HISTORY: Family History  Problem Relation Age of Onset  . Pneumonia Father   . Heart attack Brother     ALLERGIES:  has No Known Allergies.  MEDICATIONS:   Current Outpatient Medications  Medication Sig Dispense Refill  . amiodarone (PACERONE) 200 MG tablet Take 200 mg by mouth daily.     Marland Kitchen atorvastatin (LIPITOR) 40 MG tablet Take 40 mg by mouth every evening.     Mariane Baumgarten Calcium (STOOL SOFTENER PO) Take by mouth.    . folic acid (FOLVITE) 1 MG tablet Take 1 mg by mouth daily.    . isosorbide mononitrate (IMDUR) 30 MG 24 hr tablet Take 30 mg by mouth daily.     Marland Kitchen levothyroxine (SYNTHROID, LEVOTHROID) 75 MCG tablet Take 75 mcg by mouth daily.     Marland Kitchen lisinopril (PRINIVIL,ZESTRIL) 5 MG tablet Take 5 mg by mouth daily.     . mirtazapine (REMERON) 15 MG tablet Take half to one tab at night for sleep and appetite (Patient not taking: Reported on 01/08/2018) 30 tablet 12  . Multiple Vitamin (MULTIVITAMIN) tablet Take 1 tablet by mouth daily.    . Omega-3 Fatty Acids (FISH OIL) 1000 MG CAPS Take 1,000 mg by mouth daily.    Marland Kitchen omeprazole (PRILOSEC) 40 MG capsule TAKE 1 CAPSULE BY MOUTH ONCE DAILY 30 capsule 1  . ondansetron (ZOFRAN) 4 MG tablet TAKE 1 TABLET BY MOUTH EVERY 12 HOURS AS NEEDED FOR NAUSEA AND VOMITING 60 tablet 0  . Psyllium (METAMUCIL FIBER PO) Take by mouth.     No current facility-administered medications for this visit.      PHYSICAL EXAMINATION: ECOG PERFORMANCE STATUS: 1 - Symptomatic but completely ambulatory Vitals:   02/13/18 1101  BP: (!) 157/65  Pulse: (!) 55  Temp: 97.7 F (36.5 C)   Filed Weights   02/13/18 1101  Weight: 151 lb 9.1 oz (68.8 kg)    Physical Exam  Constitutional: He is oriented to person, place, and time. No distress.  Thin.   HENT:  Head: Normocephalic and atraumatic.  Mouth/Throat: Oropharynx is clear and moist. No oropharyngeal exudate.  Eyes: Pupils are equal, round, and reactive to light. EOM are normal. Right eye exhibits no discharge. No scleral icterus.  Neck: Normal range of motion. Neck supple.  Cardiovascular: Normal heart sounds.  No murmur heard. Irregular heart beats   Pulmonary/Chest: Effort normal and breath sounds normal. No respiratory distress. He has no wheezes. He has no rales.  Abdominal: Soft. Bowel sounds are normal. He exhibits no distension. There is no tenderness. There is no rebound.  Musculoskeletal: Normal range of motion. He exhibits no edema or tenderness.  Lymphadenopathy:    He has no cervical adenopathy.  Neurological: He is alert and oriented to person, place, and time. He displays normal reflexes. No cranial nerve deficit.  Skin: Skin is warm. He is not diaphoretic. No erythema.  Psychiatric: Affect normal.     LABORATORY DATA:  I have reviewed the data as listed Lab Results  Component Value Date   WBC 17.3 (  H) 01/08/2018   HGB 11.1 (L) 01/08/2018   HCT 33.9 (L) 01/08/2018   MCV 89.1 01/08/2018   PLT 188 01/08/2018   Recent Labs    10/15/17 1628 10/24/17 0919 12/05/17 1027 12/06/17 0519 12/12/17 0948  NA 139 139 140 142 140  K 4.5 4.2 4.3 4.3 4.4  CL 103 103 109 112* 109  CO2 25 28 21* 23 26  GLUCOSE 151* 96 120* 85 136*  BUN 26* 21* 26* 18 22*  CREATININE 2.58* 2.07* 1.75* 1.51* 1.70*  CALCIUM 8.5* 7.5* 8.0* 7.8* 7.6*  GFRNONAA 23* 30* 37* 44* 38*  GFRAA 27* 35* 43* 51* 45*  PROT 7.7 6.5 6.2*  --   --   ALBUMIN 3.2* 2.8* 2.5*  --   --   AST 111* 76* 113*  --   --   ALT 67* 49 53  --   --   ALKPHOS 149* 138* 234*  --   --   BILITOT 0.9 1.0 0.8  --   --       ASSESSMENT & PLAN:  1. Neutrophilia   2. Weight loss   3. CKD (chronic kidney disease) stage 4, GFR 15-29 ml/min (HCC)   4. MGUS (monoclonal gammopathy of unknown significance)   5. Localized enlarged lymph nodes   6. Abnormal weight loss   7. Adenopathy   8. Cirrhosis of liver without ascites, unspecified hepatic cirrhosis type (HCC)    Persistent Leukocytosis predominantly neutrophilia and monocytosis, trending up again.  Patient has had extensive workup including bone marrow biopsy which is nonconclusive.  Jak 2 mutation with reflex to CALR +  JAK2 exon 12-15, 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 BCR ABL negative.  cytogenetics normal.  Not explaining patient's constitutional symptoms. CT scan in January 2019 showed upper abdomen lymph nodes.  Plan obtaining PET scan for further evaluation for possible lymphoma. Patient and his wife express frustrations about multiple workup without finding out the root cause of patient's constitutional symptoms and leukocytosis etiology. #Liver cirrhosis: Advised patient to continue follow-up with Dr. Vicente Males. #Hypo-thyroidism: check TSH today.  #  weight loss; etiology unknown. Stable weight lately.  See above #Today's CBC showed persistent leukocytosis with neutrophilia.  Monocytes also slightly elevated. Patient's kidney function also showed creatinine of 1.98, slightly higher than his chronic kidney disease creatinine baseline. Suggest patient to follow up with nephrologist. Avoid NSAIDs.   All questions were answered. The patient knows to call the clinic with any problems questions or concerns. Return of visit: After PET scan  Earlie Server, MD, PhD Hematology Oncology [facility] at [facility] Pager- [phone] 02/13/2018

## 2018-02-17 DIAGNOSIS — I4891 Unspecified atrial fibrillation: Secondary | ICD-10-CM | POA: Diagnosis not present

## 2018-02-17 DIAGNOSIS — I714 Abdominal aortic aneurysm, without rupture: Secondary | ICD-10-CM | POA: Diagnosis not present

## 2018-02-17 DIAGNOSIS — N183 Chronic kidney disease, stage 3 (moderate): Secondary | ICD-10-CM | POA: Diagnosis not present

## 2018-02-17 DIAGNOSIS — Z7689 Persons encountering health services in other specified circumstances: Secondary | ICD-10-CM | POA: Diagnosis not present

## 2018-02-18 ENCOUNTER — Other Ambulatory Visit: Payer: Self-pay

## 2018-02-18 ENCOUNTER — Telehealth: Payer: Self-pay

## 2018-02-18 DIAGNOSIS — R1115 Cyclical vomiting syndrome unrelated to migraine: Secondary | ICD-10-CM

## 2018-02-18 MED ORDER — ONDANSETRON HCL 4 MG PO TABS: 4.0000 mg | ORAL_TABLET | Freq: Two times a day (BID) | ORAL | 2 refills | Status: AC

## 2018-02-18 MED ORDER — OMEPRAZOLE 40 MG PO CPDR: 40.0000 mg | DELAYED_RELEASE_CAPSULE | Freq: Every day | ORAL | 2 refills | Status: AC

## 2018-02-18 NOTE — Telephone Encounter (Signed)
I spoke to Alan Bennett advising him of the EGD needed to remove the duodenal adenoma. Alan Bennett stated this was the 1st time he'd heard about it.  After speaking to him at length, I suggested that since he was unclear about the office visit and procedure he should schedule an office visit to speak to Alan Bennett.    He states that after the hospital situation it left a 'bad taste in his mouth'. He states Alan Bennett was not there to perform his procedure and someone else had to do it. He was not happy with that. He also felt as though he's had 2 procedures for a hernia that occurred 1 week apart.   I was unable to verify these situations. Note: Alan Bennett did perform a colonoscopy while he was admitted to the hospital for general surgery.   Alan Bennett says he will look at his schedule after he's completed the PET and seen Alan Bennett and will callback to the office.  Note has been forwarded to Alan Bennett.

## 2018-02-18 NOTE — Telephone Encounter (Signed)
-----   Message from Jonathon Bellows, MD sent at 02/16/2018  6:44 PM EDT ----- Carrizo Hill was for EGD to take out duodenal polyp. Can you check if he is ready and document as a telephone encounter his decision    ----- Message ----- From: Earlie Server, MD Sent: 02/13/2018  11:09 AM To: Jonathon Bellows, MD

## 2018-02-19 ENCOUNTER — Encounter
Admission: RE | Admit: 2018-02-19 | Discharge: 2018-02-19 | Disposition: A | Discharge status: Home / Self Care | Payer: Medicare Other | Source: Ambulatory Visit | Attending: Oncology | Admitting: Oncology

## 2018-02-19 DIAGNOSIS — R590 Localized enlarged lymph nodes: Secondary | ICD-10-CM | POA: Insufficient documentation

## 2018-02-19 DIAGNOSIS — R634 Abnormal weight loss: Secondary | ICD-10-CM | POA: Diagnosis not present

## 2018-02-19 DIAGNOSIS — K7460 Unspecified cirrhosis of liver: Secondary | ICD-10-CM | POA: Diagnosis not present

## 2018-02-19 LAB — GLUCOSE, CAPILLARY: Glucose-Capillary: 103 mg/dL — ABNORMAL HIGH (ref 65–99)

## 2018-02-19 MED ORDER — FLUDEOXYGLUCOSE F - 18 (FDG) INJECTION
7.8000 | Freq: Once | INTRAVENOUS | Status: AC | PRN
  Administered 2018-02-19: 8.45 via INTRAVENOUS

## 2018-02-20 ENCOUNTER — Inpatient Hospital Stay (HOSPITAL_BASED_OUTPATIENT_CLINIC_OR_DEPARTMENT_OTHER): Payer: Medicare Other | Admitting: Oncology

## 2018-02-20 ENCOUNTER — Encounter: Payer: Self-pay | Admitting: Oncology

## 2018-02-20 VITALS — BP 146/68 | HR 52 | Temp 97.6°F | Wt 153.6 lb

## 2018-02-20 DIAGNOSIS — R188 Other ascites: Secondary | ICD-10-CM

## 2018-02-20 DIAGNOSIS — D729 Disorder of white blood cells, unspecified: Secondary | ICD-10-CM

## 2018-02-20 DIAGNOSIS — D472 Monoclonal gammopathy: Secondary | ICD-10-CM | POA: Diagnosis not present

## 2018-02-20 DIAGNOSIS — E039 Hypothyroidism, unspecified: Secondary | ICD-10-CM | POA: Diagnosis not present

## 2018-02-20 DIAGNOSIS — I714 Abdominal aortic aneurysm, without rupture: Secondary | ICD-10-CM | POA: Diagnosis not present

## 2018-02-20 DIAGNOSIS — R634 Abnormal weight loss: Secondary | ICD-10-CM

## 2018-02-20 DIAGNOSIS — Z87891 Personal history of nicotine dependence: Secondary | ICD-10-CM | POA: Diagnosis not present

## 2018-02-20 DIAGNOSIS — Z5181 Encounter for therapeutic drug level monitoring: Secondary | ICD-10-CM | POA: Diagnosis not present

## 2018-02-20 DIAGNOSIS — D72821 Monocytosis (symptomatic): Secondary | ICD-10-CM | POA: Diagnosis not present

## 2018-02-20 DIAGNOSIS — E538 Deficiency of other specified B group vitamins: Secondary | ICD-10-CM

## 2018-02-20 DIAGNOSIS — Z79899 Other long term (current) drug therapy: Secondary | ICD-10-CM

## 2018-02-20 DIAGNOSIS — I4891 Unspecified atrial fibrillation: Secondary | ICD-10-CM | POA: Diagnosis not present

## 2018-02-20 DIAGNOSIS — K219 Gastro-esophageal reflux disease without esophagitis: Secondary | ICD-10-CM | POA: Diagnosis not present

## 2018-02-20 DIAGNOSIS — K7460 Unspecified cirrhosis of liver: Secondary | ICD-10-CM | POA: Diagnosis not present

## 2018-02-20 DIAGNOSIS — D132 Benign neoplasm of duodenum: Secondary | ICD-10-CM | POA: Diagnosis not present

## 2018-02-20 DIAGNOSIS — N183 Chronic kidney disease, stage 3 (moderate): Secondary | ICD-10-CM | POA: Diagnosis not present

## 2018-02-20 NOTE — Progress Notes (Signed)
Hematology/Oncology Follow up note [facility] [phone] [phone]   Patient Care Team: Lavera Guise, MD as PCP - General (Internal Medicine)  REFERRING PROVIDER: Merlyn Lot REASON FOR VISIT Follow up for treatment of leukocytosis and neutrophilia, weight loss  HISTORY OF PRESENTING ILLNESS:  Alan Bennett is a  72 y.o.  male with PMH listed below who was referred to me for evaluation of leukocytosis.  Patient presented to ER for evaluation of 3 months weight loss, nausea and vomiting. He had blood work done which showed leukocytosis, predominantly neutrophilia and monocytosis.   Patient reports 4-6 weeks of feeling food stuck in the throat which triggers vomiting. He also has had a 15 pound weight loss over the 3 months. Denies any cough, shortness of breath. He is a former smoker, quit about 10 years ago. Denies any alcohol use.   # He has history of atrial fibrillation.# He has atrial fibrillation and is supposed to be on Eliquis for anticoagulation.  He has not started on taking it because [facility] if [facility] can be [facility].  He will have a follow-up appointment with his cardiologist and decide.  Currently not on Eliquis.  # # Patient was started on folic acid supplementation for treatment of folate deficiency. Folate level normalized.   # 11/25/2017 He had EGD/Colonoscopy  Findings includes nonbleeding internal hemorrhoids, multiple polyps in the colon which were removed and resected.  EGD findings include mild Schatzki ring, gastritis and a single duodenal polyp which was biopsied. Pathology revealed duodenal tubular adenoma, negative for high-grade dysplasia and malignancy. Stomach biopsy negative for active inflammation dyspepsia and malignancy.  Negative for H. Pylori. Multiple tubular adenoma throughout cecum, transverse colon and descending colon.  Negative for high-grade dysplasia and  malignancy. Rectum hyperplastic polyp negative for dysplasia and malignancy.  Small fragment of tubular adenoma.    # During the interval, 12/05/2017, patient presented to ER due to dark stool in the rectal bleeding for the past 5 days.  Hemoglobin dropped, considered to be secondary to lower GI bleed from polypectomy.Patient was evaluated by GI, and he had a repeat colonoscopy which demonstrated a few more polyps in the colon.  Polyps were resected and retrieved.  The pathology showed tubular adenoma/hyperplastic polyps, negative for high-grade dysplasia and malignancy. Patient is to need to follow-up with Dr. Vicente Males for duodenal adenoma removal..  #Patient also has had a chest CT chest done during admission.  There is no acute findings in the thorax.  Mild increase of abdominal adenopathy compared to scan that was done in 2018 December, most notable in the portal caval space.  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   SPEP showed 0.3g/dl polyclonal IgA with both Kappa and Lamda increased. Normal free light chain ratio. UPEP negative.   10/15/2017 CT abdomen pelvis showed 2.2cm liver cyst otherwise normal liver, no splenomegaly.  12/05/2017 CT chest w contrast showed Mild increase in abdominal adenopathy when compared with December of 2018. This is most notable in the portacaval space as described. Heterogeneity and nodularity within the liver consistent with underlying cirrhotic change. This is also more marked when compared with the prior exam  01/08/2018 Bone marrow Biopsy Bone Marrow, Aspirate,Biopsy, and Clot, right iliac BONE MARROW: - MILDLY HYPERCELLULAR MARROW WITH TRILINEAGE HEMATOPOIESIS AND MATURATION -  NO MORPHOLOGIC OR IMMUNOPHENOTYPIC EVIDENCE OF LEUKEMIA, LYMPHOMA OR MYELODYSPLASTIC SYNDROME -  SEE MICROSCOPIC DESCRIPTION BELOW PERIPHERAL BLOOD: - MILD NORMOCYTIC ANEMIA - SEE COMPLETE BLOOD COUNT # Normal cytogenetics.    INTERVAL HISTORY Alan Bennett is a 72 y.o. male who has above history reviewed by me today presents for follow up visit for management of leukocytosis, weight loss, nausea. He  continue to have poor appetite and has gained 2 pounds since last visit. he feels full.after 3-4 bites,  Wife accompanied patient to today's visit.  Denies fever chills nausea vomiting. During the interval he has had PET scan done for evaluation of lymphadenopathy.  PET IMPRESSION: 1. Cirrhosis. Mild splenomegaly. New small volume ascites. Findings suggest worsening portal hypertension.  2. Mildly enlarged portacaval and gastrohepatic ligament lymph nodes with associated low level hypermetabolism, nonspecific. Given the adjacent cirrhosis and absence of hypermetabolic adenopathy elsewhere in the body, these nodes are more likely reactive. Suggest continued CT abdomen surveillance in 3-6 months. 3. Mild cardiomegaly.  Coronary atherosclerosis. 4. Aortic Atherosclerosis (ICD10-I70.0) and Emphysema (ICD10-J43.9). 5. Infrarenal 4.0 cm abdominal aortic aneurysm, stable. Recommend followup by ultrasound in 1 year. This recommendation follows ACR consensus guidelines: White Paper of the ACR Incidental Findings Committee II on Vascular Findings. J Am Coll Radiol 2013; 10:789-794.  Review of Systems  Constitutional: Positive for malaise/fatigue and weight loss. Negative for chills and fever.  HENT: Negative for ear pain, hearing loss and nosebleeds.   Eyes: Negative for blurred vision, photophobia, pain and discharge.  Respiratory: Negative for cough, sputum production and shortness of breath.   Cardiovascular: Negative for chest pain, orthopnea and claudication.  Gastrointestinal: Negative for abdominal pain, blood in stool, heartburn, nausea and vomiting.  Genitourinary: Negative for dysuria  and frequency.  Musculoskeletal: Negative for myalgias and neck pain.  Skin: Negative for itching and rash.  Neurological: Negative for dizziness, tremors, speech change and weakness.  Endo/Heme/Allergies: Does not bruise/bleed easily.  Psychiatric/Behavioral: Negative for depression and substance abuse. The patient is not nervous/anxious.     MEDICAL HISTORY:  Past Medical History:  Diagnosis Date  . Coronary artery disease   . Hypertension   . Renal disorder   . Thyroid disease     SURGICAL HISTORY: Past Surgical History:  Procedure Laterality Date  . COLONOSCOPY WITH PROPOFOL N/A 11/25/2017   Procedure: COLONOSCOPY WITH PROPOFOL;  Surgeon: Jonathon Bellows, MD;  Location: [facility] ENDOSCOPY;  Service: Gastroenterology;  Laterality: N/A;  . COLONOSCOPY WITH PROPOFOL N/A 12/07/2017   Procedure: COLONOSCOPY WITH PROPOFOL;  Surgeon: Lin Landsman, MD;  Location: [facility] ENDOSCOPY;  Service: Gastroenterology;  Laterality: N/A;  . ESOPHAGOGASTRODUODENOSCOPY (EGD) WITH PROPOFOL N/A 11/25/2017   Procedure: ESOPHAGOGASTRODUODENOSCOPY (EGD) WITH PROPOFOL;  Surgeon: Jonathon Bellows, MD;  Location: [facility] ENDOSCOPY;  Service: Gastroenterology;  Laterality: N/A;    SOCIAL HISTORY: Social History   Socioeconomic History  . Marital status: Married    Spouse name: Not on file  . Number of children: Not on file  . Years of education: Not on file  . Highest education level: Not on file  Occupational History  . Not on file  Social Needs  . Financial resource strain: Not on file  . Food insecurity:    Worry: Not on file    Inability: Not on file  . Transportation needs:    Medical: Not on file    Non-medical: Not on file  Tobacco Use  . Smoking status: Former Smoker    Years: 20.00  . Smokeless tobacco: Never Used  .  Tobacco comment: 10 years ago  Substance and Sexual Activity  . Alcohol use: No    Frequency: Never  . Drug use: No  . Sexual activity: Yes  Lifestyle  . Physical activity:     Days per week: Not on file    Minutes per session: Not on file  . Stress: Not on file  Relationships  . Social connections:    Talks on phone: Not on file    Gets together: Not on file    Attends religious service: Not on file    Active member of club or organization: Not on file    Attends meetings of clubs or organizations: Not on file    Relationship status: Not on file  . Intimate partner violence:    Fear of current or ex partner: Not on file    Emotionally abused: Not on file    Physically abused: Not on file    Forced sexual activity: Not on file  Other Topics Concern  . Not on file  Social History Narrative  . Not on file    FAMILY HISTORY: Family History  Problem Relation Age of Onset  . Pneumonia Father   . Heart attack Brother     ALLERGIES:  has No Known Allergies.  MEDICATIONS:  Current Outpatient Medications  Medication Sig Dispense Refill  . amiodarone (PACERONE) 200 MG tablet Take 100 mg by mouth daily. Taking 1/2 tablet (100 mg) daily    . Docusate Calcium (STOOL SOFTENER PO) Take by mouth.    . isosorbide mononitrate (IMDUR) 30 MG 24 hr tablet Take 30 mg by mouth daily.     Marland Kitchen levothyroxine (SYNTHROID, LEVOTHROID) 75 MCG tablet Take 75 mcg by mouth daily.     . Multiple Vitamin (MULTIVITAMIN) tablet Take 1 tablet by mouth daily.    . Omega-3 Fatty Acids (FISH OIL) 1000 MG CAPS Take 1,000 mg by mouth daily.    Marland Kitchen omeprazole (PRILOSEC) 40 MG capsule Take 1 capsule (40 mg total) by mouth daily. 30 capsule 2  . ondansetron (ZOFRAN) 4 MG tablet Take 1 tablet (4 mg total) by mouth 2 (two) times daily. 60 tablet 2  . Psyllium (METAMUCIL FIBER PO) Take by mouth as needed.      No current facility-administered medications for this visit.      PHYSICAL EXAMINATION: ECOG PERFORMANCE STATUS: 1 - Symptomatic but completely ambulatory Vitals:   02/20/18 1026  BP: (!) 146/68  Pulse: (!) 52  Temp: 97.6 F (36.4 C)   Filed Weights   02/20/18 1024  Weight: 153  lb 8.8 oz (69.7 kg)    Physical Exam  Constitutional: He is oriented to person, place, and time. No distress.  Thin.   HENT:  Head: Normocephalic and atraumatic.  Mouth/Throat: Oropharynx is clear and moist. No oropharyngeal exudate.  Eyes: Pupils are equal, round, and reactive to light. EOM are normal. Right eye exhibits no discharge. No scleral icterus.  Neck: Normal range of motion. Neck supple.  Cardiovascular: Normal heart sounds.  No murmur heard. Irregular heart beats  Pulmonary/Chest: Effort normal and breath sounds normal. No respiratory distress. He has no wheezes. He has no rales.  Abdominal: Soft. Bowel sounds are normal. He exhibits no distension. There is no tenderness. There is no rebound.  Musculoskeletal: Normal range of motion. He exhibits no edema or tenderness.  Lymphadenopathy:    He has no cervical adenopathy.  Neurological: He is alert and oriented to person, place, and time. He displays normal reflexes.  No cranial nerve deficit.  Skin: Skin is warm. He is not diaphoretic. No erythema.  Psychiatric: Affect normal.     LABORATORY DATA:  I have reviewed the data as listed Lab Results  Component Value Date   WBC 17.0 (H) 02/13/2018   HGB 11.2 (L) 02/13/2018   HCT 34.6 (L) 02/13/2018   MCV 85.8 02/13/2018   PLT 185 02/13/2018   Recent Labs    10/24/17 0919 12/05/17 1027 12/06/17 0519 12/12/17 0948 02/13/18 0937  NA 139 140 142 140 142  K 4.2 4.3 4.3 4.4 4.9  CL 103 109 112* 109 112*  CO2 28 21* _0 GLUCOSE 96 120* 85 136* 154*  BUN 21* 26* 18 22* 31*  CREATININE 2.07* 1.75* 1.51* 1.70* 1.98*  CALCIUM 7.5* 8.0* 7.8* 7.6* 8.3*  GFRNONAA 30* 37* 44* 38* 32*  GFRAA 35* 43* 51* 45* 37*  PROT 6.5 6.2*  --   --  6.6  ALBUMIN 2.8* 2.5*  --   --  2.5*  AST 76* 113*  --   --  84*  ALT 49 53  --   --  45  ALKPHOS 138* 234*  --   --  268*  BILITOT 1.0 0.8  --   --  0.9      ASSESSMENT & PLAN:  1. Cirrhosis of liver with ascites, unspecified  hepatic cirrhosis type ([place])   2. MGUS (monoclonal gammopathy of unknown significance)   3. Weight loss   4. Folate deficiency    # PET scan result was personally reviewed by me and discussed with patient and his wife.  It appears that upper abdomen lymph adenopathy has low non specific metabolic activity, given cirrhotic liver, localized portacaval and gastrohepatic ligament lymph nodes, likely reactive. Will repeat scan in 3-6 months.   # discussed that patient has worsening cirrohsis and also small amount of ascites. Advise patient to follow up with Dr.Anna for further evaluation. Patient's wife requests to be referred to another GI group for second opinion. Will refer to [facility] group.   # Persistent Leukocytosis predominantly neutrophilia and monocytosis,  Patient has had extensive workup including bone marrow biopsy which is nonconclusive.  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 BCR ABL negative.  cytogenetics normal. Not explaining patient's constitutional symptoms. CT scan in January 2019 showed upper abdomen lymph nodes. PET scan non specific.  All work up so far is negative. Will send foundation one testing which may give additional clue.   #Hypo-thyroidism:  TSH elevated. Advise patient to follow up with primary care physician and have Synthroid adjusted.  #  weight loss; etiology unknown. Stable weight lately.  See above #  Infra renal AAA, follow up with primary care physician.   All questions were answered. The patient knows to call the clinic with any problems questions or concerns. Return of visit: 4 months or earlier if needed.   Total face to face encounter time for this patient visit was 40 min. >50% of the time was  spent in counseling and coordination of care.  Earlie Server, MD, PhD Hematology Oncology [facility] Pager- [phone] 02/20/2018

## 2018-03-18 DIAGNOSIS — Z5181 Encounter for therapeutic drug level monitoring: Secondary | ICD-10-CM | POA: Diagnosis not present

## 2018-03-18 DIAGNOSIS — E039 Hypothyroidism, unspecified: Secondary | ICD-10-CM | POA: Diagnosis not present

## 2018-03-19 ENCOUNTER — Other Ambulatory Visit
Admission: RE | Admit: 2018-03-19 | Discharge: 2018-03-19 | Disposition: A | Discharge status: Home / Self Care | Payer: Medicare Other | Source: Ambulatory Visit | Attending: Internal Medicine | Admitting: Internal Medicine

## 2018-03-19 DIAGNOSIS — R410 Disorientation, unspecified: Secondary | ICD-10-CM | POA: Diagnosis not present

## 2018-03-19 DIAGNOSIS — R188 Other ascites: Secondary | ICD-10-CM | POA: Diagnosis not present

## 2018-03-19 DIAGNOSIS — E875 Hyperkalemia: Secondary | ICD-10-CM | POA: Diagnosis not present

## 2018-03-19 DIAGNOSIS — H2513 Age-related nuclear cataract, bilateral: Secondary | ICD-10-CM | POA: Diagnosis not present

## 2018-03-19 DIAGNOSIS — N183 Chronic kidney disease, stage 3 (moderate): Secondary | ICD-10-CM | POA: Insufficient documentation

## 2018-03-19 LAB — AMMONIA: Ammonia: 62 umol/L — ABNORMAL HIGH (ref 9–35)

## 2018-03-25 DIAGNOSIS — E039 Hypothyroidism, unspecified: Secondary | ICD-10-CM | POA: Diagnosis not present

## 2018-03-25 DIAGNOSIS — D472 Monoclonal gammopathy: Secondary | ICD-10-CM | POA: Diagnosis not present

## 2018-03-25 DIAGNOSIS — R188 Other ascites: Secondary | ICD-10-CM | POA: Diagnosis not present

## 2018-03-25 DIAGNOSIS — K219 Gastro-esophageal reflux disease without esophagitis: Secondary | ICD-10-CM | POA: Diagnosis not present

## 2018-03-25 DIAGNOSIS — E875 Hyperkalemia: Secondary | ICD-10-CM | POA: Diagnosis not present

## 2018-03-25 DIAGNOSIS — R410 Disorientation, unspecified: Secondary | ICD-10-CM | POA: Diagnosis not present

## 2018-03-25 DIAGNOSIS — N183 Chronic kidney disease, stage 3 (moderate): Secondary | ICD-10-CM | POA: Diagnosis not present

## 2018-03-25 DIAGNOSIS — D132 Benign neoplasm of duodenum: Secondary | ICD-10-CM | POA: Diagnosis not present

## 2018-03-25 DIAGNOSIS — K7460 Unspecified cirrhosis of liver: Secondary | ICD-10-CM | POA: Diagnosis not present

## 2018-03-25 DIAGNOSIS — I4891 Unspecified atrial fibrillation: Secondary | ICD-10-CM | POA: Diagnosis not present

## 2018-04-01 DIAGNOSIS — E875 Hyperkalemia: Secondary | ICD-10-CM | POA: Diagnosis not present

## 2018-04-03 DIAGNOSIS — I10 Essential (primary) hypertension: Secondary | ICD-10-CM | POA: Diagnosis not present

## 2018-04-03 DIAGNOSIS — N183 Chronic kidney disease, stage 3 (moderate): Secondary | ICD-10-CM | POA: Diagnosis not present

## 2018-04-03 DIAGNOSIS — I5022 Chronic systolic (congestive) heart failure: Secondary | ICD-10-CM | POA: Diagnosis not present

## 2018-04-07 ENCOUNTER — Ambulatory Visit: Payer: Medicare Other | Admitting: Gastroenterology

## 2018-04-24 DIAGNOSIS — M5489 Other dorsalgia: Secondary | ICD-10-CM | POA: Diagnosis not present

## 2018-04-24 DIAGNOSIS — M545 Low back pain: Secondary | ICD-10-CM | POA: Diagnosis not present

## 2018-04-24 DIAGNOSIS — K7290 Hepatic failure, unspecified without coma: Secondary | ICD-10-CM | POA: Diagnosis not present

## 2018-04-24 DIAGNOSIS — W010XXA Fall on same level from slipping, tripping and stumbling without subsequent striking against object, initial encounter: Secondary | ICD-10-CM | POA: Diagnosis not present

## 2018-04-24 DIAGNOSIS — S3992XA Unspecified injury of lower back, initial encounter: Secondary | ICD-10-CM | POA: Diagnosis not present

## 2018-04-24 DIAGNOSIS — I4891 Unspecified atrial fibrillation: Secondary | ICD-10-CM | POA: Diagnosis not present

## 2018-04-24 DIAGNOSIS — R188 Other ascites: Secondary | ICD-10-CM | POA: Diagnosis not present

## 2018-04-24 DIAGNOSIS — N183 Chronic kidney disease, stage 3 (moderate): Secondary | ICD-10-CM | POA: Diagnosis not present

## 2018-04-24 DIAGNOSIS — S299XXA Unspecified injury of thorax, initial encounter: Secondary | ICD-10-CM | POA: Diagnosis not present

## 2018-04-24 DIAGNOSIS — Y92009 Unspecified place in unspecified non-institutional (private) residence as the place of occurrence of the external cause: Secondary | ICD-10-CM | POA: Diagnosis not present

## 2018-04-24 DIAGNOSIS — K7460 Unspecified cirrhosis of liver: Secondary | ICD-10-CM | POA: Diagnosis not present

## 2018-04-28 ENCOUNTER — Other Ambulatory Visit: Payer: Self-pay | Admitting: Internal Medicine

## 2018-04-28 DIAGNOSIS — I714 Abdominal aortic aneurysm, without rupture, unspecified: Secondary | ICD-10-CM

## 2018-04-29 ENCOUNTER — Other Ambulatory Visit: Payer: Self-pay | Admitting: Internal Medicine

## 2018-04-29 DIAGNOSIS — I714 Abdominal aortic aneurysm, without rupture, unspecified: Secondary | ICD-10-CM

## 2018-05-02 ENCOUNTER — Ambulatory Visit
Admission: RE | Admit: 2018-05-02 | Discharge: 2018-05-02 | Disposition: A | Discharge status: Home / Self Care | Payer: Medicare Other | Source: Ambulatory Visit | Attending: Internal Medicine | Admitting: Internal Medicine

## 2018-05-02 DIAGNOSIS — I714 Abdominal aortic aneurysm, without rupture, unspecified: Secondary | ICD-10-CM

## 2018-05-02 DIAGNOSIS — R188 Other ascites: Secondary | ICD-10-CM | POA: Diagnosis not present

## 2018-05-02 DIAGNOSIS — Z136 Encounter for screening for cardiovascular disorders: Secondary | ICD-10-CM | POA: Diagnosis not present

## 2018-05-21 DIAGNOSIS — I4891 Unspecified atrial fibrillation: Secondary | ICD-10-CM | POA: Diagnosis not present

## 2018-05-21 DIAGNOSIS — I714 Abdominal aortic aneurysm, without rupture: Secondary | ICD-10-CM | POA: Diagnosis not present

## 2018-05-27 ENCOUNTER — Other Ambulatory Visit: Payer: Self-pay | Admitting: Internal Medicine

## 2018-05-27 DIAGNOSIS — R188 Other ascites: Secondary | ICD-10-CM | POA: Diagnosis not present

## 2018-05-27 DIAGNOSIS — N183 Chronic kidney disease, stage 3 (moderate): Secondary | ICD-10-CM | POA: Diagnosis not present

## 2018-05-27 DIAGNOSIS — D132 Benign neoplasm of duodenum: Secondary | ICD-10-CM | POA: Diagnosis not present

## 2018-05-27 DIAGNOSIS — K7290 Hepatic failure, unspecified without coma: Secondary | ICD-10-CM | POA: Diagnosis not present

## 2018-05-27 DIAGNOSIS — K7460 Unspecified cirrhosis of liver: Secondary | ICD-10-CM

## 2018-05-27 DIAGNOSIS — K219 Gastro-esophageal reflux disease without esophagitis: Secondary | ICD-10-CM | POA: Diagnosis not present

## 2018-05-27 DIAGNOSIS — D472 Monoclonal gammopathy: Secondary | ICD-10-CM | POA: Diagnosis not present

## 2018-05-27 DIAGNOSIS — K766 Portal hypertension: Secondary | ICD-10-CM | POA: Diagnosis not present

## 2018-05-29 ENCOUNTER — Ambulatory Visit
Admission: RE | Admit: 2018-05-29 | Discharge: 2018-05-29 | Disposition: A | Discharge status: Home / Self Care | Payer: Medicare Other | Source: Ambulatory Visit | Attending: Internal Medicine | Admitting: Internal Medicine

## 2018-05-29 DIAGNOSIS — R188 Other ascites: Secondary | ICD-10-CM | POA: Diagnosis not present

## 2018-05-29 DIAGNOSIS — K7460 Unspecified cirrhosis of liver: Secondary | ICD-10-CM | POA: Diagnosis not present

## 2018-05-29 DIAGNOSIS — K829 Disease of gallbladder, unspecified: Secondary | ICD-10-CM | POA: Insufficient documentation

## 2018-06-04 ENCOUNTER — Other Ambulatory Visit: Payer: Self-pay

## 2018-06-04 ENCOUNTER — Inpatient Hospital Stay
Admission: EM | Admit: 2018-06-04 | Discharge: 2018-07-06 | DRG: 085 | Disposition: E | Payer: Medicare Other | Attending: Internal Medicine | Admitting: Internal Medicine

## 2018-06-04 ENCOUNTER — Encounter: Payer: Self-pay | Admitting: Emergency Medicine

## 2018-06-04 ENCOUNTER — Emergency Department: Payer: Medicare Other

## 2018-06-04 DIAGNOSIS — W109XXA Fall (on) (from) unspecified stairs and steps, initial encounter: Secondary | ICD-10-CM | POA: Diagnosis present

## 2018-06-04 DIAGNOSIS — I714 Abdominal aortic aneurysm, without rupture: Secondary | ICD-10-CM | POA: Diagnosis present

## 2018-06-04 DIAGNOSIS — K7460 Unspecified cirrhosis of liver: Secondary | ICD-10-CM | POA: Diagnosis present

## 2018-06-04 DIAGNOSIS — D638 Anemia in other chronic diseases classified elsewhere: Secondary | ICD-10-CM | POA: Diagnosis present

## 2018-06-04 DIAGNOSIS — S020XXA Fracture of vault of skull, initial encounter for closed fracture: Secondary | ICD-10-CM | POA: Diagnosis present

## 2018-06-04 DIAGNOSIS — N179 Acute kidney failure, unspecified: Secondary | ICD-10-CM | POA: Diagnosis present

## 2018-06-04 DIAGNOSIS — Z79899 Other long term (current) drug therapy: Secondary | ICD-10-CM

## 2018-06-04 DIAGNOSIS — Z79890 Hormone replacement therapy (postmenopausal): Secondary | ICD-10-CM

## 2018-06-04 DIAGNOSIS — M1711 Unilateral primary osteoarthritis, right knee: Secondary | ICD-10-CM | POA: Diagnosis present

## 2018-06-04 DIAGNOSIS — I70213 Atherosclerosis of native arteries of extremities with intermittent claudication, bilateral legs: Secondary | ICD-10-CM | POA: Diagnosis present

## 2018-06-04 DIAGNOSIS — W19XXXA Unspecified fall, initial encounter: Secondary | ICD-10-CM

## 2018-06-04 DIAGNOSIS — S0101XA Laceration without foreign body of scalp, initial encounter: Secondary | ICD-10-CM | POA: Diagnosis present

## 2018-06-04 DIAGNOSIS — Z8249 Family history of ischemic heart disease and other diseases of the circulatory system: Secondary | ICD-10-CM

## 2018-06-04 DIAGNOSIS — Z681 Body mass index (BMI) 19 or less, adult: Secondary | ICD-10-CM

## 2018-06-04 DIAGNOSIS — R000 Tachycardia, unspecified: Secondary | ICD-10-CM | POA: Diagnosis not present

## 2018-06-04 DIAGNOSIS — S0990XA Unspecified injury of head, initial encounter: Secondary | ICD-10-CM | POA: Diagnosis not present

## 2018-06-04 DIAGNOSIS — D472 Monoclonal gammopathy: Secondary | ICD-10-CM | POA: Diagnosis present

## 2018-06-04 DIAGNOSIS — I2510 Atherosclerotic heart disease of native coronary artery without angina pectoris: Secondary | ICD-10-CM | POA: Diagnosis present

## 2018-06-04 DIAGNOSIS — F0390 Unspecified dementia without behavioral disturbance: Secondary | ICD-10-CM | POA: Diagnosis present

## 2018-06-04 DIAGNOSIS — K219 Gastro-esophageal reflux disease without esophagitis: Secondary | ICD-10-CM | POA: Diagnosis present

## 2018-06-04 DIAGNOSIS — S06350A Traumatic hemorrhage of left cerebrum without loss of consciousness, initial encounter: Secondary | ICD-10-CM

## 2018-06-04 DIAGNOSIS — S0003XA Contusion of scalp, initial encounter: Secondary | ICD-10-CM | POA: Diagnosis not present

## 2018-06-04 DIAGNOSIS — Z66 Do not resuscitate: Secondary | ICD-10-CM | POA: Diagnosis present

## 2018-06-04 DIAGNOSIS — I612 Nontraumatic intracerebral hemorrhage in hemisphere, unspecified: Secondary | ICD-10-CM | POA: Diagnosis not present

## 2018-06-04 DIAGNOSIS — K7290 Hepatic failure, unspecified without coma: Secondary | ICD-10-CM | POA: Diagnosis present

## 2018-06-04 DIAGNOSIS — D6959 Other secondary thrombocytopenia: Secondary | ICD-10-CM | POA: Diagnosis present

## 2018-06-04 DIAGNOSIS — N183 Chronic kidney disease, stage 3 (moderate): Secondary | ICD-10-CM | POA: Diagnosis present

## 2018-06-04 DIAGNOSIS — S06320A Contusion and laceration of left cerebrum without loss of consciousness, initial encounter: Secondary | ICD-10-CM

## 2018-06-04 DIAGNOSIS — S065X9A Traumatic subdural hemorrhage with loss of consciousness of unspecified duration, initial encounter: Secondary | ICD-10-CM

## 2018-06-04 DIAGNOSIS — S199XXA Unspecified injury of neck, initial encounter: Secondary | ICD-10-CM | POA: Diagnosis not present

## 2018-06-04 DIAGNOSIS — I619 Nontraumatic intracerebral hemorrhage, unspecified: Secondary | ICD-10-CM | POA: Diagnosis present

## 2018-06-04 DIAGNOSIS — S06360A Traumatic hemorrhage of cerebrum, unspecified, without loss of consciousness, initial encounter: Principal | ICD-10-CM | POA: Diagnosis present

## 2018-06-04 DIAGNOSIS — I959 Hypotension, unspecified: Secondary | ICD-10-CM | POA: Diagnosis not present

## 2018-06-04 DIAGNOSIS — Z515 Encounter for palliative care: Secondary | ICD-10-CM | POA: Diagnosis present

## 2018-06-04 DIAGNOSIS — R296 Repeated falls: Secondary | ICD-10-CM | POA: Diagnosis present

## 2018-06-04 DIAGNOSIS — E039 Hypothyroidism, unspecified: Secondary | ICD-10-CM | POA: Diagnosis present

## 2018-06-04 DIAGNOSIS — I129 Hypertensive chronic kidney disease with stage 1 through stage 4 chronic kidney disease, or unspecified chronic kidney disease: Secondary | ICD-10-CM | POA: Diagnosis present

## 2018-06-04 DIAGNOSIS — S065XAA Traumatic subdural hemorrhage with loss of consciousness status unknown, initial encounter: Secondary | ICD-10-CM

## 2018-06-04 DIAGNOSIS — E43 Unspecified severe protein-calorie malnutrition: Secondary | ICD-10-CM | POA: Diagnosis present

## 2018-06-04 DIAGNOSIS — Z87891 Personal history of nicotine dependence: Secondary | ICD-10-CM

## 2018-06-04 DIAGNOSIS — S0191XA Laceration without foreign body of unspecified part of head, initial encounter: Secondary | ICD-10-CM | POA: Diagnosis not present

## 2018-06-04 DIAGNOSIS — S065X0A Traumatic subdural hemorrhage without loss of consciousness, initial encounter: Secondary | ICD-10-CM | POA: Diagnosis not present

## 2018-06-04 DIAGNOSIS — I472 Ventricular tachycardia: Secondary | ICD-10-CM

## 2018-06-04 DIAGNOSIS — S0240EA Zygomatic fracture, right side, initial encounter for closed fracture: Secondary | ICD-10-CM | POA: Diagnosis present

## 2018-06-04 LAB — TYPE AND SCREEN
ABO/RH(D): A POS
Antibody Screen: NEGATIVE

## 2018-06-04 LAB — BASIC METABOLIC PANEL
ANION GAP: 11 (ref 5–15)
BUN: 25 mg/dL — ABNORMAL HIGH (ref 8–23)
CHLORIDE: 111 mmol/L (ref 98–111)
CO2: 18 mmol/L — AB (ref 22–32)
Calcium: 8.1 mg/dL — ABNORMAL LOW (ref 8.9–10.3)
Creatinine, Ser: 2.42 mg/dL — ABNORMAL HIGH (ref 0.61–1.24)
GFR calc non Af Amer: 25 mL/min — ABNORMAL LOW (ref >60)
GFR, EST AFRICAN AMERICAN: 29 mL/min — AB (ref >60)
Glucose, Bld: 120 mg/dL — ABNORMAL HIGH (ref 70–99)
POTASSIUM: 4.4 mmol/L (ref 3.5–5.1)
SODIUM: 140 mmol/L (ref 135–145)

## 2018-06-04 LAB — CBC
HEMATOCRIT: 25.8 % — AB (ref 40.0–52.0)
HEMOGLOBIN: 8.8 g/dL — AB (ref 13.0–18.0)
MCH: 31.1 pg (ref 26.0–34.0)
MCHC: 34.1 g/dL (ref 32.0–36.0)
MCV: 91.2 fL (ref 80.0–100.0)
PLATELETS: 141 10*3/uL — AB (ref 150–440)
RBC: 2.83 MIL/uL — AB (ref 4.40–5.90)
RDW: 19.7 % — ABNORMAL HIGH (ref 11.5–14.5)
WBC: 17.3 10*3/uL — AB (ref 3.8–10.6)

## 2018-06-04 MED ORDER — AMIODARONE HCL IN DEXTROSE 360-4.14 MG/200ML-% IV SOLN: 30.0000 mg/h | INTRAVENOUS | Status: DC

## 2018-06-04 MED ORDER — AMIODARONE HCL IN DEXTROSE 360-4.14 MG/200ML-% IV SOLN
INTRAVENOUS | Status: AC
  Administered 2018-06-04: 60 mg/h via INTRAVENOUS
  Filled 2018-06-04: qty 200

## 2018-06-04 MED ORDER — AMIODARONE HCL IN DEXTROSE 360-4.14 MG/200ML-% IV SOLN
60.0000 mg/h | INTRAVENOUS | Status: DC
  Administered 2018-06-04 – 2018-06-05 (×2): 60 mg/h via INTRAVENOUS
  Filled 2018-06-04: qty 200

## 2018-06-04 MED ORDER — AMIODARONE IV BOLUS ONLY 150 MG/100ML
150.0000 mg | Freq: Once | INTRAVENOUS | Status: AC
  Administered 2018-06-04: 150 mg via INTRAVENOUS

## 2018-06-04 MED ORDER — SODIUM CHLORIDE 0.9 % IV SOLN
500.0000 mg | Freq: Two times a day (BID) | INTRAVENOUS | Status: DC
  Filled 2018-06-04 (×7): qty 5

## 2018-06-04 MED ORDER — SODIUM CHLORIDE 0.9 % IV BOLUS
1000.0000 mL | Freq: Once | INTRAVENOUS | Status: AC
  Administered 2018-06-04: 1000 mL via INTRAVENOUS

## 2018-06-04 MED ORDER — MIDAZOLAM HCL 5 MG/5ML IJ SOLN: 2.0000 mg | Freq: Once | INTRAMUSCULAR | Status: DC

## 2018-06-04 MED ORDER — FENTANYL CITRATE (PF) 100 MCG/2ML IJ SOLN
INTRAMUSCULAR | Status: AC
  Filled 2018-06-04: qty 2

## 2018-06-04 MED ORDER — ONDANSETRON 4 MG PO TBDP
4.0000 mg | ORAL_TABLET | Freq: Once | ORAL | Status: AC
  Administered 2018-06-04: 4 mg via ORAL

## 2018-06-04 MED ORDER — LEVETIRACETAM IN NACL 500 MG/100ML IV SOLN
500.0000 mg | Freq: Two times a day (BID) | INTRAVENOUS | Status: DC
  Filled 2018-06-04 (×2): qty 100

## 2018-06-04 MED ORDER — AMIODARONE IV BOLUS ONLY 150 MG/100ML
INTRAVENOUS | Status: AC
  Administered 2018-06-04: 150 mg via INTRAVENOUS
  Filled 2018-06-04: qty 100

## 2018-06-04 MED ORDER — MIDAZOLAM HCL 5 MG/5ML IJ SOLN
INTRAMUSCULAR | Status: AC
  Filled 2018-06-04: qty 5

## 2018-06-04 MED ORDER — FENTANYL CITRATE (PF) 100 MCG/2ML IJ SOLN: 50.0000 ug | Freq: Once | INTRAMUSCULAR | Status: DC

## 2018-06-04 MED ORDER — LIDOCAINE HCL (PF) 1 % IJ SOLN
INTRAMUSCULAR | Status: AC
  Filled 2018-06-04: qty 5

## 2018-06-04 NOTE — Consult Note (Signed)
I was contacted by Dr. Archie Balboa for presence of intracranial hemorrhage.    Alan Bennett came in to the ER after fall.  He suffered LOC but is now at normal baseline mental status.  HCT shows 20x93mm L anterior temporal pole IPH and mixed density L FP subdural hematoma.   - hold anticoagulation unless needed for life-threatening cardiopulmonary condition - repeat head ct in 6-12 hours - give keppra 500 mg BID for 7 days - will need MRI with and without contrast in 2 months to confirm no underlying mass given its appearance - ok to admit with close observation until the repeat hct is performed. q2 hour neurochecks overnight, then space out if patient is stable  Alan Maw MD

## 2018-06-04 NOTE — ED Notes (Signed)
Applied [place]in[place] O2 @2liters   With RA sat of 90%, pt remains awake and alert, resting comfortably , family remains at bedside.

## 2018-06-04 NOTE — ED Provider Notes (Signed)
[facility] Emergency Department Provider Note   ____________________________________________   I have reviewed the triage vital signs and the nursing notes.   HISTORY  Chief Complaint Fall   History limited by: Not Limited   HPI Alan Bennett is a 72 y.o. male who presents to the emergency department today after a fall. Family states patients balance has been poor for months. Was going up some steps and trying to get through a door when he lost his balance. [place] backwards down three steps and hit concrete. Patient did not lose consciousness. The patient did suffer a laceration to his scalp. He denies any nausea or vomiting afterwards, he denies any blurry vision. Did suffer tears to bilateral elbows.   Per medical record review patient has a history of CAD, HTN.   Past Medical History:  Diagnosis Date  . Coronary artery disease   . Hypertension   . Renal disorder   . Thyroid disease     Patient Active Problem List   Diagnosis Date Noted  . Weight loss 02/20/2018  . MGUS (monoclonal gammopathy of unknown significance) 02/20/2018  . Folate deficiency 02/20/2018  . Cirrhosis of liver with ascites ([place]) 02/20/2018  . Protein-calorie malnutrition, severe 12/07/2017  . Anemia, unspecified 12/06/2017  . Acute GI bleeding   . Rectal bleeding 12/05/2017  . Carotid stenosis 06/17/2017  . Atherosclerosis of artery of extremity with intermittent claudication ([place]) 06/17/2017  . A-fib ([place]) 06/17/2017  . Essential hypertension 06/17/2017  . CAD (coronary artery disease) 06/17/2017  . Stress fracture of right femur with routine healing 12/25/2016  . Primary osteoarthritis of right knee 12/27/2014    Past Surgical History:  Procedure Laterality Date  . COLONOSCOPY WITH PROPOFOL N/A 11/25/2017   Procedure: COLONOSCOPY WITH PROPOFOL;  Surgeon: Jonathon Bellows, MD;  Location: [facility] ENDOSCOPY;  Service: Gastroenterology;  Laterality: N/A;  . COLONOSCOPY WITH  PROPOFOL N/A 12/07/2017   Procedure: COLONOSCOPY WITH PROPOFOL;  Surgeon: Lin Landsman, MD;  Location: [facility] ENDOSCOPY;  Service: Gastroenterology;  Laterality: N/A;  . ESOPHAGOGASTRODUODENOSCOPY (EGD) WITH PROPOFOL N/A 11/25/2017   Procedure: ESOPHAGOGASTRODUODENOSCOPY (EGD) WITH PROPOFOL;  Surgeon: Jonathon Bellows, MD;  Location: [facility] ENDOSCOPY;  Service: Gastroenterology;  Laterality: N/A;    Prior to Admission medications   Medication Sig Start Date End Date Taking? Authorizing Provider  amiodarone (PACERONE) 200 MG tablet Take 100 mg by mouth daily. Taking 1/2 tablet (100 mg) daily 03/21/17   [provider]  Docusate Calcium (STOOL SOFTENER PO) Take by mouth.    [provider]  isosorbide mononitrate (IMDUR) 30 MG 24 hr tablet Take 30 mg by mouth daily.  04/28/17   [provider]  levothyroxine (SYNTHROID, LEVOTHROID) 75 MCG tablet Take 75 mcg by mouth daily.  04/08/17   [provider]  Multiple Vitamin (MULTIVITAMIN) tablet Take 1 tablet by mouth daily.    [provider]  Omega-3 Fatty Acids (FISH OIL) 1000 MG CAPS Take 1,000 mg by mouth daily.    [provider]  omeprazole (PRILOSEC) 40 MG capsule Take 1 capsule (40 mg total) by mouth daily. 02/18/18   Jonathon Bellows, MD  Psyllium (METAMUCIL FIBER PO) Take by mouth as needed.     [provider]    Allergies Patient has no known allergies.  Family History  Problem Relation Age of Onset  . Pneumonia Father   . Heart attack Brother     Social History Social History   Tobacco Use  . Smoking status: Former Smoker  Years: 20.00  . Smokeless tobacco: Never Used  . Tobacco comment: 10 years ago  Substance Use Topics  . Alcohol use: No    Frequency: Never  . Drug use: No    Review of Systems Constitutional: No fever/chills Eyes: No visual changes. ENT: No sore throat. Cardiovascular: Denies chest pain. Respiratory: Denies shortness of breath. Gastrointestinal:  No abdominal pain.  No nausea, no vomiting.  No diarrhea.   Genitourinary: Negative for dysuria. Musculoskeletal: Negative for back pain. Skin: Positive for laceration to his occiput, skin tears to bilateral elbows.  Neurological: Negative for headaches, focal weakness or numbness.  ____________________________________________   PHYSICAL EXAM:  VITAL SIGNS: ED Triage Vitals  Enc Vitals Group     BP 05/28/2018 1916 123/65     Pulse Rate 05/18/2018 1916 62     Resp 05/05/2018 1916 18     Temp 05/15/2018 1916 98.1 F (36.7 C)     Temp Source 05/11/2018 1916 Oral     SpO2 05/11/2018 1916 98 %     Weight 06/03/2018 1917 140 lb (63.5 kg)     Height 05/24/2018 1917 6\' 2"  (1.88 m)     Head Circumference --      Peak Flow --      Pain Score 05/17/2018 1916 3   Constitutional: Alert and oriented.  Eyes: Conjunctivae are normal.  ENT      Head: Normocephalic. Laceration and hematoma to his occiput.      Nose: Small amount of blood in bilateral nares.      Mouth/Throat: Mucous membranes are moist.      Neck: No stridor. Hematological/Lymphatic/Immunilogical: No cervical lymphadenopathy. Cardiovascular: Normal rate, regular rhythm.  No murmurs, rubs, or gallops.  Respiratory: Normal respiratory effort without tachypnea nor retractions. Breath sounds are clear and equal bilaterally. No wheezes/rales/rhonchi. Gastrointestinal: Soft and non tender. No rebound. No guarding.  Genitourinary: Deferred Musculoskeletal: Normal range of motion in all extremities. No lower extremity edema. Neurologic:  Normal speech and language. No gross focal neurologic deficits are appreciated.  Skin:  Skin is warm, dry and intact. No rash noted. Psychiatric: Mood and affect are normal. Speech and behavior are normal. Patient exhibits appropriate insight and judgment.  ____________________________________________    LABS (pertinent positives/negatives)  BMP na 140, k 4.4, cr 2.42 CBC wbc 17.3, hgb 8.8, plt  141  ____________________________________________   EKG  I, Nance Pear, attending physician, personally viewed and interpreted this EKG  EKG Time: 1907 Rate: 61 Rhythm: sinus rhythm Axis: normal Intervals: qtc 455 QRS: Nonspecific intraventricular conduction delay ST changes: no st elevation Impression: abnormal ekg  I, Nance Pear, attending physician, personally viewed and interpreted this EKG  EKG Time: 2031 Rate: 174 Rhythm: wide complex tachycardia Axis: right axis Intervals: qtc 552 QRS: wide ST changes: no st elevation Impression: abnormal ekg  I, Nance Pear, attending physician, personally viewed and interpreted this EKG  EKG Time: 2046 Rate: 126 Rhythm: sinus tachycardia Axis: normal Intervals: qtc 499 QRS: IVCD ST changes: no st elevation Impression: abnormal ekg   ____________________________________________    RADIOLOGY  CT head Acute parietal fracture, acute intraparanchamal bleed and SDH  CT max/face Zygomatic arch fracture  CT cervical spine Negative  ____________________________________________   PROCEDURES  Procedures  LACERATION REPAIR Performed by: Nance Pear Authorized by: Nance Pear Consent: Verbal consent obtained. Risks and benefits: risks, benefits and alternatives were discussed Consent given by: patient Patient identity confirmed: provided demographic data Prepped and Draped in normal sterile fashion Wound explored  Laceration Location: occiput  Laceration Length: 2 cm  No Foreign Bodies seen or palpated  Anesthesia: local infiltration  Local anesthetic: lidocaine 1% without epinephrine  Anesthetic total: 2 ml  Irrigation method: syringe Amount of cleaning: standard  Skin closure: staples  Number of staples: 5  Patient tolerance: Patient tolerated the procedure well with no immediate complications.  CRITICAL CARE Performed by: Nance Pear   Total critical care  time: 45 minutes  Critical care time was exclusive of separately billable procedures and treating other patients.  Critical care was necessary to treat or prevent imminent or life-threatening deterioration.  Critical care was time spent personally by me on the following activities: development of treatment plan with patient and/or surrogate as well as nursing, discussions with consultants, evaluation of patient's response to treatment, examination of patient, obtaining history from patient or surrogate, ordering and performing treatments and interventions, ordering and review of laboratory studies, ordering and review of radiographic studies, pulse oximetry and re-evaluation of patient's condition.  ____________________________________________   INITIAL IMPRESSION / ASSESSMENT AND PLAN / ED COURSE  Pertinent labs & imaging results that were available during my care of the patient were reviewed by me and considered in my medical decision making (see chart for details).   Patient presented to the emergency department today after a fall.  Patient did suffer a laceration to his occiput.  Head CT was obtained which did show acute bleeds.  Discussed with Dr. Cari Caraway of neurosurgery.  At this point he does not think this will likely result in a surgical injury.  That it would be reasonable for patient be admitted to our facility.  Discussed this with the patient's family who was comfortable with this decision.  However during the patient stay in the emergency department he did develop a wide-complex tachycardia.  Patient was awake and alert throughout this time.  Given that he was clinically stable albeit with a slight blood pressure drop he was given amiodarone.  This did break him out of it.  Patient did not need to be shocked in the emergency department.  Patient was continued on amiodarone.  Patient will be admitted to the hospital service.  Discussed findings plan of care with patient and  family.  ____________________________________________   FINAL CLINICAL IMPRESSION(S) / ED DIAGNOSES  Final diagnoses:  Fall, initial encounter  Subdural hematoma ([place])  Intraparenchymal hematoma of left side of brain due to trauma, without loss of consciousness, initial encounter ([place])  Wide-complex tachycardia ([place])     Note: This dictation was prepared with Dragon dictation. Any transcriptional errors that result from this process are unintentional     Nance Pear, MD 05/25/2018 [phone]

## 2018-06-04 NOTE — ED Triage Notes (Signed)
Ems pt from home , fell backward approx 3 step onto concert lac / hematoma to right scalp , abrasions / bilateral elbow.  Pt alert on arrival  , confusion at baseline

## 2018-06-04 NOTE — H&P (Addendum)
[facility] [place] NAME: Alan Bennett    MR#:  [id number]  DATE OF BIRTH:  June 16, 1946  DATE OF ADMISSION:  05/18/2018  PRIMARY CARE PHYSICIAN: Baxter Hire, MD   REQUESTING/REFERRING PHYSICIAN:   CHIEF COMPLAINT:   Chief Complaint  Patient presents with  . Fall    HISTORY OF PRESENT ILLNESS: Alan Bennett  is a 72 y.o. male with a known history of advanced liver cirrhosis, coronary artery disease, chronic kidney disease, thyroid disorder. Patient is currently confused, unable to provide history.  Most of the information was taken from reviewing the medical chart and from discussion with the emergency room physician and patient's wife. He was brought to emergency room status post mechanical fall at home.  He fell backwards down 3 steps and hit the concrete.  Patient did not lose her consciousness but suffered a laceration to his scalp and superficial tears to bilateral elbows.  Further evaluation in the emergency room revealed right parietal bone fracture, subdural hematoma with mass-effect, 3 mm left to right midline shift.  There is also small volume subarachnoid hemorrhage mostly over the left anterior frontal and temporal lobes.  Minimally displaced right zygomatic arch fracture is noted as well. Blood test are remarkable for hemoglobin level at 8.8, WBC is 17.3, creatinine level is 2.42 platelets 141, BUN 25. Per wife, patient's health has been declining in the past 2 months, his appetite is poor and his gait is very unstable. Patient is admitted for further evaluation and treatment.  PAST MEDICAL HISTORY:   Past Medical History:  Diagnosis Date  . Coronary artery disease   . Hypertension   . Renal disorder   . Thyroid disease     PAST SURGICAL HISTORY:  Past Surgical History:  Procedure Laterality Date  . COLONOSCOPY WITH PROPOFOL N/A 11/25/2017   Procedure: COLONOSCOPY WITH PROPOFOL;  Surgeon: Jonathon Bellows, MD;  Location:  [facility] ENDOSCOPY;  Service: Gastroenterology;  Laterality: N/A;  . COLONOSCOPY WITH PROPOFOL N/A 12/07/2017   Procedure: COLONOSCOPY WITH PROPOFOL;  Surgeon: Lin Landsman, MD;  Location: [facility] ENDOSCOPY;  Service: Gastroenterology;  Laterality: N/A;  . ESOPHAGOGASTRODUODENOSCOPY (EGD) WITH PROPOFOL N/A 11/25/2017   Procedure: ESOPHAGOGASTRODUODENOSCOPY (EGD) WITH PROPOFOL;  Surgeon: Jonathon Bellows, MD;  Location: [facility] ENDOSCOPY;  Service: Gastroenterology;  Laterality: N/A;    SOCIAL HISTORY:  Social History   Tobacco Use  . Smoking status: Former Smoker    Years: 20.00  . Smokeless tobacco: Never Used  . Tobacco comment: 10 years ago  Substance Use Topics  . Alcohol use: No    Frequency: Never    FAMILY HISTORY:  Family History  Problem Relation Age of Onset  . Pneumonia Father   . Heart attack Brother     DRUG ALLERGIES: No Known Allergies  REVIEW OF SYSTEMS:    Unable to provide due to confusion.  MEDICATIONS AT HOME:  Prior to Admission medications   Medication Sig Start Date End Date Taking? Authorizing Provider  amiodarone (PACERONE) 200 MG tablet Take 100 mg by mouth daily.  03/21/17  Yes [provider]  lactulose (CHRONULAC) 10 GM/15ML solution Take 30 mLs by mouth 2 (two) times daily.  05/12/18  Yes [provider]  levothyroxine (SYNTHROID, LEVOTHROID) 88 MCG tablet Take 88 mcg by mouth daily before breakfast. 05/09/18  Yes [provider]  Multiple Vitamin (MULTIVITAMIN) tablet Take 1 tablet by mouth daily.   Yes [provider]  omeprazole (PRILOSEC) 40 MG  capsule Take 1 capsule (40 mg total) by mouth daily. 02/18/18  Yes Jonathon Bellows, MD  ondansetron (ZOFRAN) 4 MG tablet Take 4 mg by mouth every 8 (eight) hours as needed for nausea or vomiting.   Yes [provider]  vancomycin (VANCOCIN) 125 MG capsule Take 125 mg by mouth 4 (four) times daily. 05/28/18  Yes [provider]      PHYSICAL EXAMINATION:   VITAL  SIGNS: Blood pressure 137/85, pulse (!) 130, temperature 98.1 F (36.7 C), temperature source Oral, resp. rate (!) 26, height 6\' 2"  (1.88 m), weight 63.5 kg (140 lb), SpO2 97 %.  GENERAL:  72 y.o.-year-old patient lying in the bed, confused, uncomfortable, agitated.  He looks very thin, chronically ill. EYES: Pupils equal, round, reactive to light and accommodation. No scleral icterus. Extraocular muscles intact.  HEENT: There is a 2 cm laceration with 4 staples present at posterior right occipital area.    NECK:  Supple, no jugular venous distention. No thyroid enlargement, no tenderness.  LUNGS: Reduced breath sounds bilaterally.  Rhonchi's are heard at both lung areas.  No use of accessory muscles of respiration.  CARDIOVASCULAR: S1, S2 normal. No murmurs, rubs, or gallops.  ABDOMEN: Soft, nontender, nondistended. Bowel sounds present. No organomegaly or mass.  EXTREMITIES: No pedal edema, cyanosis, or clubbing.  NEUROLOGIC: Patient is moving all his extremities, no focal weakness appreciated. PSYCHIATRIC: The patient is alert, but confused, agitated.  SKIN: There is a 2 cm laceration with 4 staples present at posterior right occipital area.  Scattered skin tears at bilateral elbows are noted.  Right periorbital ecchymosis is seen.  Overall, patient's skin is very dry and flaky.      LABORATORY PANEL:   CBC Recent Labs  Lab 06/03/2018 1925  WBC 17.3*  HGB 8.8*  HCT 25.8*  PLT 141*  MCV 91.2  MCH 31.1  MCHC 34.1  RDW 19.7*   ------------------------------------------------------------------------------------------------------------------  Chemistries  Recent Labs  Lab 05/05/2018 1925  NA 140  K 4.4  CL 111  CO2 18*  GLUCOSE 120*  BUN 25*  CREATININE 2.42*  CALCIUM 8.1*   ------------------------------------------------------------------------------------------------------------------ estimated creatinine clearance is 24.4 mL/min (A) (by C-G formula based on SCr of 2.42  mg/dL (H)). ------------------------------------------------------------------------------------------------------------------ No results for input(s): TSH, T4TOTAL, T3FREE, THYROIDAB in the last 72 hours.  Invalid input(s): FREET3   Coagulation profile No results for input(s): INR, PROTIME in the last 168 hours. ------------------------------------------------------------------------------------------------------------------- No results for input(s): DDIMER in the last 72 hours. -------------------------------------------------------------------------------------------------------------------  Cardiac Enzymes No results for input(s): CKMB, TROPONINI, MYOGLOBIN in the last 168 hours.  Invalid input(s): CK ------------------------------------------------------------------------------------------------------------------ Invalid input(s): POCBNP  ---------------------------------------------------------------------------------------------------------------  Urinalysis    Component Value Date/Time   COLORURINE AMBER (A) 10/15/2017 1949   APPEARANCEUR HAZY (A) 10/15/2017 1949   APPEARANCEUR Hazy 03/03/2013 1013   LABSPEC 1.021 10/15/2017 1949   LABSPEC 1.026 03/03/2013 1013   PHURINE 5.0 10/15/2017 1949   GLUCOSEU NEGATIVE 10/15/2017 1949   GLUCOSEU Negative 03/03/2013 1013   HGBUR NEGATIVE 10/15/2017 1949   BILIRUBINUR NEGATIVE 10/15/2017 1949   BILIRUBINUR Negative 03/03/2013 1013   KETONESUR NEGATIVE 10/15/2017 1949   PROTEINUR 30 (A) 10/15/2017 1949   NITRITE NEGATIVE 10/15/2017 1949   LEUKOCYTESUR NEGATIVE 10/15/2017 1949   LEUKOCYTESUR Negative 03/03/2013 1013     RADIOLOGY: Ct Head Wo Contrast  Result Date: 05/31/2018 CLINICAL DATA:  72 y/o M; fall with laceration and hematoma to the right scalp. EXAM: CT HEAD WITHOUT CONTRAST CT MAXILLOFACIAL WITHOUT CONTRAST CT CERVICAL  SPINE WITHOUT CONTRAST TECHNIQUE: Multidetector CT imaging of the head, cervical spine, and  maxillofacial structures were performed using the standard protocol without intravenous contrast. Multiplanar CT image reconstructions of the cervical spine and maxillofacial structures were also generated. COMPARISON:  12/07/2017 CT head. FINDINGS: CT HEAD FINDINGS Brain: Thin acute subdural hematoma along the falx and over the left cerebral convexity with mild mass effect on the brain resulting in 3 mm of left-to-right midline shift. The subdural hematomas overlying the left cerebral convexity measure up to 5 mm in thickness. Small volume of subarachnoid hemorrhage predominantly over the left anterior frontal and temporal convexities, trace subarachnoid hemorrhage within the right sylvian fissure. Hemorrhagic cortical contusions within the left anterior temporal and left anterior inferior frontal lobes. No herniation. Background of mild chronic microvascular ischemic changes and parenchymal volume loss of the brain as well as small chronic lacunar infarctions within the left caudate head and lentiform nucleus. Vascular: Calcific atherosclerosis of carotid siphons. No hyperdense vessel. Skull: Nondisplaced acute fracture of the right parietal bone propagating into the right temporal squamous. 2.2 cm hematoma and contusion of the right parietal scalp. Other: None. CT MAXILLOFACIAL FINDINGS Osseous: Minimally displaced acute fracture of the right zygomatic arch. No additional acute fracture identified. Orbits: Negative. No traumatic or inflammatory finding. Sinuses: Partial opacification of the right mastoid tip with sclerosis compatible sequelae of chronic otomastoiditis. Mild left ethmoid air cell mucosal thickening. Soft tissues: Negative. CT CERVICAL SPINE FINDINGS Alignment: Mild reversal of cervical lordosis with grade 1 C4-5 anterolisthesis and associated prominent facet arthropathy. Skull base and vertebrae: No acute fracture. No primary bone lesion or focal pathologic process. Soft tissues and spinal canal:  No prevertebral fluid or swelling. No visible canal hematoma. Disc levels: Osteophytosis of the anterior C1-2 joint. Discogenic degenerative changes greatest at the C4-C7 levels. Multilevel prominent facet arthropathy. Uncovertebral and facet arthropathy on the right results in neural foraminal stenosis at the C3-4 and C5-C7 levels. On the left there is neural foraminal stenosis at C3-C7. no high-grade bony canal stenosis. Upper chest: Centrilobular emphysema of the lung apices. Other: Calcific atherosclerosis of carotid siphons. IMPRESSION: CT head: 1. Large right parietal scalp contusion. Nondisplaced acute fracture of right parietal bone extending into the right temporal squamous bone. 2. Contrecoup hemorrhagic cortical contusions of the left anterior frontal and temporal lobes. 3. Thin subdural hematoma along the falx and over left cerebral convexity. 4. Small volume subarachnoid hemorrhage predominantly over the left anterior frontal and temporal lobes. 5. Mass effect associated with thin subdural hematomas resulting in 3 mm left-to-right midline shift. No herniation. CT maxillofacial: 1. Minimally displaced acute fracture of the right zygomatic arch. 2. No additional facial fracture injury. CT cervical spine. 1. No acute fracture or dislocation of the cervical spine. 2. Moderate cervical spondylosis. 3. Centrilobular emphysema of the lungs. Critical Value/emergent results were called by telephone at the time of interpretation on 05/08/2018 at 9:01 pm to Dr. Nance Pear , who verbally acknowledged these results. Electronically Signed   By: Kristine Garbe M.D.   On: 05/29/2018 21:06   Ct Cervical Spine Wo Contrast  Result Date: 05/07/2018 CLINICAL DATA:  72 y/o M; fall with laceration and hematoma to the right scalp. EXAM: CT HEAD WITHOUT CONTRAST CT MAXILLOFACIAL WITHOUT CONTRAST CT CERVICAL SPINE WITHOUT CONTRAST TECHNIQUE: Multidetector CT imaging of the head, cervical spine, and  maxillofacial structures were performed using the standard protocol without intravenous contrast. Multiplanar CT image reconstructions of the cervical spine and maxillofacial structures were also generated. COMPARISON:  12/07/2017 CT head. FINDINGS: CT HEAD FINDINGS Brain: Thin acute subdural hematoma along the falx and over the left cerebral convexity with mild mass effect on the brain resulting in 3 mm of left-to-right midline shift. The subdural hematomas overlying the left cerebral convexity measure up to 5 mm in thickness. Small volume of subarachnoid hemorrhage predominantly over the left anterior frontal and temporal convexities, trace subarachnoid hemorrhage within the right sylvian fissure. Hemorrhagic cortical contusions within the left anterior temporal and left anterior inferior frontal lobes. No herniation. Background of mild chronic microvascular ischemic changes and parenchymal volume loss of the brain as well as small chronic lacunar infarctions within the left caudate head and lentiform nucleus. Vascular: Calcific atherosclerosis of carotid siphons. No hyperdense vessel. Skull: Nondisplaced acute fracture of the right parietal bone propagating into the right temporal squamous. 2.2 cm hematoma and contusion of the right parietal scalp. Other: None. CT MAXILLOFACIAL FINDINGS Osseous: Minimally displaced acute fracture of the right zygomatic arch. No additional acute fracture identified. Orbits: Negative. No traumatic or inflammatory finding. Sinuses: Partial opacification of the right mastoid tip with sclerosis compatible sequelae of chronic otomastoiditis. Mild left ethmoid air cell mucosal thickening. Soft tissues: Negative. CT CERVICAL SPINE FINDINGS Alignment: Mild reversal of cervical lordosis with grade 1 C4-5 anterolisthesis and associated prominent facet arthropathy. Skull base and vertebrae: No acute fracture. No primary bone lesion or focal pathologic process. Soft tissues and spinal canal:  No prevertebral fluid or swelling. No visible canal hematoma. Disc levels: Osteophytosis of the anterior C1-2 joint. Discogenic degenerative changes greatest at the C4-C7 levels. Multilevel prominent facet arthropathy. Uncovertebral and facet arthropathy on the right results in neural foraminal stenosis at the C3-4 and C5-C7 levels. On the left there is neural foraminal stenosis at C3-C7. no high-grade bony canal stenosis. Upper chest: Centrilobular emphysema of the lung apices. Other: Calcific atherosclerosis of carotid siphons. IMPRESSION: CT head: 1. Large right parietal scalp contusion. Nondisplaced acute fracture of right parietal bone extending into the right temporal squamous bone. 2. Contrecoup hemorrhagic cortical contusions of the left anterior frontal and temporal lobes. 3. Thin subdural hematoma along the falx and over left cerebral convexity. 4. Small volume subarachnoid hemorrhage predominantly over the left anterior frontal and temporal lobes. 5. Mass effect associated with thin subdural hematomas resulting in 3 mm left-to-right midline shift. No herniation. CT maxillofacial: 1. Minimally displaced acute fracture of the right zygomatic arch. 2. No additional facial fracture injury. CT cervical spine. 1. No acute fracture or dislocation of the cervical spine. 2. Moderate cervical spondylosis. 3. Centrilobular emphysema of the lungs. Critical Value/emergent results were called by telephone at the time of interpretation on 05/15/2018 at 9:01 pm to Dr. Nance Pear , who verbally acknowledged these results. Electronically Signed   By: Kristine Garbe M.D.   On: 05/07/2018 21:06   Ct Maxillofacial Wo Contrast  Result Date: 05/11/2018 CLINICAL DATA:  72 y/o M; fall with laceration and hematoma to the right scalp. EXAM: CT HEAD WITHOUT CONTRAST CT MAXILLOFACIAL WITHOUT CONTRAST CT CERVICAL SPINE WITHOUT CONTRAST TECHNIQUE: Multidetector CT imaging of the head, cervical spine, and maxillofacial  structures were performed using the standard protocol without intravenous contrast. Multiplanar CT image reconstructions of the cervical spine and maxillofacial structures were also generated. COMPARISON:  12/07/2017 CT head. FINDINGS: CT HEAD FINDINGS Brain: Thin acute subdural hematoma along the falx and over the left cerebral convexity with mild mass effect on the brain resulting in 3 mm of left-to-right midline shift. The subdural hematomas overlying the  left cerebral convexity measure up to 5 mm in thickness. Small volume of subarachnoid hemorrhage predominantly over the left anterior frontal and temporal convexities, trace subarachnoid hemorrhage within the right sylvian fissure. Hemorrhagic cortical contusions within the left anterior temporal and left anterior inferior frontal lobes. No herniation. Background of mild chronic microvascular ischemic changes and parenchymal volume loss of the brain as well as small chronic lacunar infarctions within the left caudate head and lentiform nucleus. Vascular: Calcific atherosclerosis of carotid siphons. No hyperdense vessel. Skull: Nondisplaced acute fracture of the right parietal bone propagating into the right temporal squamous. 2.2 cm hematoma and contusion of the right parietal scalp. Other: None. CT MAXILLOFACIAL FINDINGS Osseous: Minimally displaced acute fracture of the right zygomatic arch. No additional acute fracture identified. Orbits: Negative. No traumatic or inflammatory finding. Sinuses: Partial opacification of the right mastoid tip with sclerosis compatible sequelae of chronic otomastoiditis. Mild left ethmoid air cell mucosal thickening. Soft tissues: Negative. CT CERVICAL SPINE FINDINGS Alignment: Mild reversal of cervical lordosis with grade 1 C4-5 anterolisthesis and associated prominent facet arthropathy. Skull base and vertebrae: No acute fracture. No primary bone lesion or focal pathologic process. Soft tissues and spinal canal: No  prevertebral fluid or swelling. No visible canal hematoma. Disc levels: Osteophytosis of the anterior C1-2 joint. Discogenic degenerative changes greatest at the C4-C7 levels. Multilevel prominent facet arthropathy. Uncovertebral and facet arthropathy on the right results in neural foraminal stenosis at the C3-4 and C5-C7 levels. On the left there is neural foraminal stenosis at C3-C7. no high-grade bony canal stenosis. Upper chest: Centrilobular emphysema of the lung apices. Other: Calcific atherosclerosis of carotid siphons. IMPRESSION: CT head: 1. Large right parietal scalp contusion. Nondisplaced acute fracture of right parietal bone extending into the right temporal squamous bone. 2. Contrecoup hemorrhagic cortical contusions of the left anterior frontal and temporal lobes. 3. Thin subdural hematoma along the falx and over left cerebral convexity. 4. Small volume subarachnoid hemorrhage predominantly over the left anterior frontal and temporal lobes. 5. Mass effect associated with thin subdural hematomas resulting in 3 mm left-to-right midline shift. No herniation. CT maxillofacial: 1. Minimally displaced acute fracture of the right zygomatic arch. 2. No additional facial fracture injury. CT cervical spine. 1. No acute fracture or dislocation of the cervical spine. 2. Moderate cervical spondylosis. 3. Centrilobular emphysema of the lungs. Critical Value/emergent results were called by telephone at the time of interpretation on 05/20/2018 at 9:01 pm to Dr. Nance Pear , who verbally acknowledged these results. Electronically Signed   By: Kristine Garbe M.D.   On: 05/06/2018 21:06    EKG: Orders placed or performed during the hospital encounter of 06/01/2018  . EKG 12-Lead  . EKG 12-Lead  . ED EKG within 10 minutes  . ED EKG within 10 minutes  . ED EKG  . ED EKG  . EKG 12-Lead  . EKG 12-Lead    IMPRESSION AND PLAN:  1.  Acute encephalopathy, likely multifactorial secondary to  intracranial hemorrhage and hepatic encephalopathy 2.  Subdural hematoma, status post mechanical fall 3.  Subarachnoid hemorrhage, status post mechanical fall.  There is mass-effect noted, with 3 mm left to right midline shift 4.  Right parietal bone fracture, status post mechanical fall 5.  Advanced liver cirrhosis 6.  Anemia, of chronic disease worsened by 2 days head trauma.  Hemoglobin level is 8.8, from 11.2 last month. 7.  Thrombocytopenia, likely related to liver cirrhosis. 8.  Wide-complex tachycardia, on amiodarone IV. 9. ARF/CKD3  Plan: Per neurosurgery,  conservative management is recommended, as patient is a poor surgical candidate. Overall, patient's prognosis is very poor.  This was discussed with patient's wife, who is the power of attorney.  The decision was made for DNR and withdrawal of care.  Palliative care team is consulted. We will hold p.o. medications, due to patient's altered mental status.  Continue supportive measures with IV morphine and Ativan.  Continue amiodarone IV to control the heart rate and Keppra IV to prevent seizures.   All the records are reviewed and case discussed with ED provider. Management plans discussed with the patient, family and they are in agreement.  CODE STATUS: DNR    Code Status Orders  (From admission, onward)        Start     Ordered   05/25/2018 2331  Do not attempt resuscitation (DNR)  Continuous    Question Answer Comment  In the event of cardiac or respiratory ARREST Do not call a "code blue"   In the event of cardiac or respiratory ARREST Do not perform Intubation, CPR, defibrillation or ACLS   In the event of cardiac or respiratory ARREST Use medication by any route, position, wound care, and other measures to relive pain and suffering. May use oxygen, suction and manual treatment of airway obstruction as needed for comfort.      05/14/2018 2330    Code Status History    Date Active Date Inactive Code Status Order ID  Comments User Context   12/05/2017 1415 12/07/2017 2044 Full Code [id number]  Demetrios Loll, MD Inpatient    Advance Directive Documentation     Most Recent Value  Type of Advance Directive  Healthcare Power of Attorney  Pre-existing out of facility DNR order (yellow form or pink MOST form)  -  "MOST" Form in Place?  -       TOTAL TIME TAKING CARE OF THIS PATIENT: 50 minutes.    Amelia Jo M.D on 05/08/2018 at 11:44 PM  Between 7am to 6pm - Pager - [phone]  After 6pm go to [email or web address] - password [email or web address] [email or web address]  Office  [phone]  CC: Primary care physician; Baxter Hire, MD

## 2018-06-04 NOTE — ED Notes (Signed)
Pt noted to have wide complex v-tach on monitor, MD notified and EKG obtained.  MD at bedside, orders being placed by Dr. Archie Balboa.

## 2018-06-05 DIAGNOSIS — I619 Nontraumatic intracerebral hemorrhage, unspecified: Secondary | ICD-10-CM | POA: Diagnosis present

## 2018-06-05 DIAGNOSIS — I612 Nontraumatic intracerebral hemorrhage in hemisphere, unspecified: Secondary | ICD-10-CM

## 2018-06-05 LAB — CBC
HEMATOCRIT: 30.5 % — AB (ref 40.0–52.0)
Hemoglobin: 9.9 g/dL — ABNORMAL LOW (ref 13.0–18.0)
MCH: 30.5 pg (ref 26.0–34.0)
MCHC: 32.6 g/dL (ref 32.0–36.0)
MCV: 93.5 fL (ref 80.0–100.0)
PLATELETS: 211 10*3/uL (ref 150–440)
RBC: 3.26 MIL/uL — ABNORMAL LOW (ref 4.40–5.90)
RDW: 19.8 % — AB (ref 11.5–14.5)
WBC: 25.2 10*3/uL — ABNORMAL HIGH (ref 3.8–10.6)

## 2018-06-05 LAB — BASIC METABOLIC PANEL
Anion gap: 14 (ref 5–15)
BUN: 24 mg/dL — AB (ref 8–23)
CO2: 15 mmol/L — AB (ref 22–32)
CREATININE: 2.18 mg/dL — AB (ref 0.61–1.24)
Calcium: 8.2 mg/dL — ABNORMAL LOW (ref 8.9–10.3)
Chloride: 112 mmol/L — ABNORMAL HIGH (ref 98–111)
GFR calc Af Amer: 33 mL/min — ABNORMAL LOW (ref >60)
GFR calc non Af Amer: 28 mL/min — ABNORMAL LOW (ref >60)
GLUCOSE: 213 mg/dL — AB (ref 70–99)
POTASSIUM: 4.2 mmol/L (ref 3.5–5.1)
Sodium: 141 mmol/L (ref 135–145)

## 2018-06-05 LAB — TSH: TSH: 2.397 u[IU]/mL (ref 0.350–4.500)

## 2018-06-05 LAB — GLUCOSE, CAPILLARY: Glucose-Capillary: 189 mg/dL — ABNORMAL HIGH (ref 70–99)

## 2018-06-05 MED ORDER — MORPHINE SULFATE (PF) 2 MG/ML IV SOLN
1.0000 mg | INTRAVENOUS | Status: DC | PRN
  Administered 2018-06-05: 2 mg via INTRAVENOUS
  Filled 2018-06-05 (×2): qty 1

## 2018-06-05 MED ORDER — LORAZEPAM 2 MG/ML IJ SOLN
0.5000 mg | INTRAMUSCULAR | Status: DC | PRN
  Administered 2018-06-05: 0.5 mg via INTRAVENOUS

## 2018-06-05 MED ORDER — SODIUM CHLORIDE 0.9 % IV SOLN
Freq: Once | INTRAVENOUS | Status: AC
  Administered 2018-06-05: 75 mL/h via INTRAVENOUS

## 2018-06-05 MED ORDER — ACETAMINOPHEN 650 MG RE SUPP: 650.0000 mg | Freq: Four times a day (QID) | RECTAL | Status: DC | PRN

## 2018-06-05 MED ORDER — MORPHINE SULFATE (PF) 2 MG/ML IV SOLN: 1.0000 mg | INTRAVENOUS | Status: DC | PRN

## 2018-06-05 MED ORDER — HALOPERIDOL LACTATE 5 MG/ML IJ SOLN: 0.5000 mg | INTRAMUSCULAR | Status: DC | PRN

## 2018-06-05 MED ORDER — POLYVINYL ALCOHOL 1.4 % OP SOLN
1.0000 [drp] | Freq: Four times a day (QID) | OPHTHALMIC | Status: DC | PRN
  Filled 2018-06-05: qty 15

## 2018-06-05 MED ORDER — DEXMEDETOMIDINE HCL IN NACL 400 MCG/100ML IV SOLN
0.4000 ug/kg/h | INTRAVENOUS | Status: DC
  Administered 2018-06-05: 0.5 ug/kg/h via INTRAVENOUS
  Administered 2018-06-05: 2 ug/kg/h via INTRAVENOUS
  Filled 2018-06-05: qty 100

## 2018-06-05 MED ORDER — TRAZODONE HCL 50 MG PO TABS: 25.0000 mg | ORAL_TABLET | Freq: Every evening | ORAL | Status: DC | PRN

## 2018-06-05 MED ORDER — MORPHINE SULFATE (PF) 2 MG/ML IV SOLN
2.0000 mg | Freq: Once | INTRAVENOUS | Status: AC
  Administered 2018-06-05: 2 mg via INTRAVENOUS

## 2018-06-05 MED ORDER — ACETAMINOPHEN 325 MG PO TABS: 650.0000 mg | ORAL_TABLET | Freq: Four times a day (QID) | ORAL | Status: DC | PRN

## 2018-06-05 MED ORDER — LORAZEPAM 2 MG/ML IJ SOLN
1.0000 mg | INTRAMUSCULAR | Status: DC | PRN
  Administered 2018-06-05: 1 mg via INTRAVENOUS
  Filled 2018-06-05: qty 1

## 2018-06-05 MED ORDER — GLYCOPYRROLATE 0.2 MG/ML IJ SOLN
0.2000 mg | INTRAMUSCULAR | Status: DC | PRN
  Administered 2018-06-05 – 2018-06-06 (×4): 0.2 mg via INTRAVENOUS
  Filled 2018-06-05 (×6): qty 1

## 2018-06-05 MED ORDER — BIOTENE DRY MOUTH MT LIQD: 15.0000 mL | OROMUCOSAL | Status: DC | PRN

## 2018-06-05 MED ORDER — LORAZEPAM 2 MG/ML IJ SOLN
1.0000 mg | Freq: Once | INTRAMUSCULAR | Status: AC
  Administered 2018-06-05: 1 mg via INTRAVENOUS

## 2018-06-05 MED ORDER — DOCUSATE SODIUM 100 MG PO CAPS: 100.0000 mg | ORAL_CAPSULE | Freq: Two times a day (BID) | ORAL | Status: DC

## 2018-06-05 MED ORDER — HYDROCODONE-ACETAMINOPHEN 5-325 MG PO TABS: 1.0000 | ORAL_TABLET | ORAL | Status: DC | PRN

## 2018-06-05 MED ORDER — ONDANSETRON HCL 4 MG/2ML IJ SOLN: 4.0000 mg | Freq: Four times a day (QID) | INTRAMUSCULAR | Status: DC | PRN

## 2018-06-05 MED ORDER — MORPHINE 100MG IN NS 100ML (1MG/ML) PREMIX INFUSION
1.0000 mg/h | INTRAVENOUS | Status: DC
  Administered 2018-06-05: 0.5 mg/h via INTRAVENOUS
  Administered 2018-06-06: 13:00:00 6 mg/h via INTRAVENOUS
  Administered 2018-06-06: 11:00:00 5 mg/h via INTRAVENOUS
  Filled 2018-06-05 (×2): qty 100

## 2018-06-05 MED ORDER — MORPHINE BOLUS VIA INFUSION
1.0000 mg | INTRAVENOUS | Status: DC | PRN
  Administered 2018-06-05 (×2): 2 mg via INTRAVENOUS
  Filled 2018-06-05: qty 2

## 2018-06-05 MED ORDER — ONDANSETRON HCL 4 MG PO TABS: 4.0000 mg | ORAL_TABLET | Freq: Four times a day (QID) | ORAL | Status: DC | PRN

## 2018-06-05 MED ORDER — LORAZEPAM 2 MG/ML IJ SOLN
INTRAMUSCULAR | Status: AC
  Administered 2018-06-05: 0.5 mg via INTRAVENOUS
  Filled 2018-06-05: qty 1

## 2018-06-05 MED ORDER — BISACODYL 5 MG PO TBEC: 5.0000 mg | DELAYED_RELEASE_TABLET | Freq: Every day | ORAL | Status: DC | PRN

## 2018-06-05 MED ORDER — HALOPERIDOL LACTATE 5 MG/ML IJ SOLN
INTRAMUSCULAR | Status: AC
  Administered 2018-06-05: 2.5 mg
  Filled 2018-06-05: qty 1

## 2018-06-05 NOTE — Progress Notes (Addendum)
Chaplain received a call to support the patient's wife, Mrs. Randall Hiss ([address] [place] who was alone in the ICU waiting room. The nurse said that she was having a difficult time given her husband's prognosis. He fell and hit his head.  He is not expected to recover. Chaplain engaged in active and reflective listening, listened to a life review, discussed re-imagining a future and the hopes God might have for her with the patient's wife. She is understandably distraught and very tearful. They have no children, but the patient's brother will return to the hospital tomorrow. Patient's wife discussed her role as caregiver to her mother, father, and now to her husband. They made funeral arrangements for themselves yesterday and she questions timing as well as if she is to blame for husband's fate. She is unsure of what her purpose will be after his death. Chaplain and Mrs. Granda prayed together before moving to ICU. Patient's wife and chaplain continued conversation and chaplain provided emotional and anticipatory grief support. Nurse will page as needed. Chaplain will follow up before concluding shift.    06/05/18 0400  Clinical Encounter Type  Visited With Family  Visit Type Initial;Spiritual support  Spiritual Encounters  Spiritual Needs Prayer;Emotional

## 2018-06-05 NOTE — Progress Notes (Signed)
   06/05/18 1055  Clinical Encounter Type  Visited With Patient and family together  Visit Type Follow-up   Chaplain followed up with patient's visitors who reported that spouse had stepped out and would return shortly.  Chaplain returned 15 minutes later to talk with spouse who declined visit and indicated that she would call if her needs change.

## 2018-06-05 NOTE — Progress Notes (Signed)
eLink Physician-Brief Progress Note Patient Name: Alan Bennett DOB: 03/11/1946 MRN: [id number]   Date of Service  06/05/2018  HPI/Events of Note  Hypoyhyroidism  eICU Interventions  TSH level ordered.        [name] 06/05/2018, 1:41 AM

## 2018-06-05 NOTE — Consult Note (Signed)
Name: Alan Bennett MRN: [id number] DOB: 10-16-1946    ADMISSION DATE:  05/05/2018 CONSULTATION DATE: 06/05/2018  REFERRING MD : Dr. Duane Boston   CHIEF COMPLAINT: Fall   BRIEF PATIENT DESCRIPTION:  72 yo male admitted to stepdown unit following a fall at home CT Head revealed acute parietal fracture, intraparenchymal hematoma, and subdural hematoma.  Developed wide complex tachycardia requiring amiodarone gtt   SIGNIFICANT EVENTS/STUDIES:  07/31 Pt admitted to stepdown unit  07/31 CT Head/Cervical Spine/Maxillofacial revealed large right parietal scalp contusion. Nondisplaced acute fracture of right parietal bone extending into the right temporal squamous bone. Contrecoup hemorrhagic cortical contusions of the left anterior frontal and temporal lobes. Thin subdural hematoma along the falx and over left cerebral convexity. Small volume subarachnoid hemorrhage predominantly over the left anterior frontal and temporal lobes. Mass effect associated with thin subdural hematomas resulting in 3 mm left-to-right midline shift. No herniation. Minimally displaced acute fracture of the right zygomatic arch. No additional facial fracture injury. No acute fracture or dislocation of the cervical spine. Moderate cervical spondylosis. Centrilobular emphysema of the lungs.  HISTORY OF PRESENT ILLNESS:   This is a 72 yo male with a PMH of Atrial Fibrillation, Hypothyroidism, Dementia, CHF, Osteoarthritis, Cirrhosis of Liver with Ascites, HTN, GERD, Duodenal Adenoma, and Abdominal Aortic Aneurysm.  He presented to [facility] ER 07/31 after falling backwards down three steps hitting concrete. He did not lose consciousness following the fall, however he did suffer a 2 cm laceration to the scalp requiring 4 staples and skin tears to bilateral elbows. Per ER notes he has had poor balance and frequent falls over several months. In the ER CT Head revealed acute parietal fracture, intraparenchymal hematoma, and subdural hematoma  neurosurgery consulted.  ER physician discussed CT Head results with neurosurgery who stated he did not think surgery was indicated and recommended admission to [facility] and close observation.  In the ER pts EKG revealed wide complex tachycardia with heart rate 174 and pt hypotensive, therefore amiodarone gtt initiated.  He was subsequently admitted to the stepdown unit by hospitalist team for further workup and treatment.    PAST MEDICAL HISTORY :   has a past medical history of Coronary artery disease, Hypertension, Renal disorder, and Thyroid disease.  has a past surgical history that includes Esophagogastroduodenoscopy (egd) with propofol (N/A, 11/25/2017); Colonoscopy with propofol (N/A, 11/25/2017); and Colonoscopy with propofol (N/A, 12/07/2017). Prior to Admission medications   Medication Sig Start Date End Date Taking? Authorizing Provider  amiodarone (PACERONE) 200 MG tablet Take 100 mg by mouth daily.  03/21/17  Yes [provider]  lactulose (CHRONULAC) 10 GM/15ML solution Take 30 mLs by mouth 2 (two) times daily.  05/12/18  Yes [provider]  levothyroxine (SYNTHROID, LEVOTHROID) 88 MCG tablet Take 88 mcg by mouth daily before breakfast. 05/09/18  Yes [provider]  Multiple Vitamin (MULTIVITAMIN) tablet Take 1 tablet by mouth daily.   Yes [provider]  omeprazole (PRILOSEC) 40 MG capsule Take 1 capsule (40 mg total) by mouth daily. 02/18/18  Yes Jonathon Bellows, MD  ondansetron (ZOFRAN) 4 MG tablet Take 4 mg by mouth every 8 (eight) hours as needed for nausea or vomiting.   Yes [provider]  vancomycin (VANCOCIN) 125 MG capsule Take 125 mg by mouth 4 (four) times daily. 05/28/18  Yes [provider]   No Known Allergies  FAMILY HISTORY:  family history includes Heart attack in his brother; Pneumonia in his father. SOCIAL HISTORY:  reports that he has  quit smoking. He quit after 20.00 years of use. He has never used smokeless tobacco. He  reports that he does not drink alcohol or use drugs.  REVIEW OF SYSTEMS:   Unable to assess pt confused   SUBJECTIVE:  Unable to assess pt confused   VITAL SIGNS: Temp:  [98.1 F (36.7 C)] 98.1 F (36.7 C) (07/31 1916) Pulse Rate:  [48-172] 116 (08/01 0100) Resp:  [16-36] 36 (08/01 0100) BP: (65-151)/(54-94) 151/94 (08/01 0100) SpO2:  [82 %-98 %] 82 % (08/01 0100) Weight:  [63.5 kg (140 lb)] 63.5 kg (140 lb) (07/31 1917)  PHYSICAL EXAMINATION: General: chronically ill appearing male agitated Neuro: confused and agitated, not following commands, PERRL  HEENT: supple, mild JVD  Cardiovascular: wide complex tachycardia, no R/G  Lungs: rhonchi throughout with gurgling respirations, slightly tachypneic  Abdomen: hypoactive BS x4, soft, non distended   Musculoskeletal: frail cachetic, no edema  Skin: 2 cm laceration with 4 staples present at posterior right occipital area, scattered skin tears bilateral upper and lower extremities, right periorbital ecchymosis, dry flaky scaly skin   Recent Labs  Lab 05/16/2018 1925  NA 140  K 4.4  CL 111  CO2 18*  BUN 25*  CREATININE 2.42*  GLUCOSE 120*   Recent Labs  Lab 05/17/2018 1925  HGB 8.8*  HCT 25.8*  WBC 17.3*  PLT 141*   Ct Head Wo Contrast  Result Date: 05/19/2018 CLINICAL DATA:  72 y/o M; fall with laceration and hematoma to the right scalp. EXAM: CT HEAD WITHOUT CONTRAST CT MAXILLOFACIAL WITHOUT CONTRAST CT CERVICAL SPINE WITHOUT CONTRAST TECHNIQUE: Multidetector CT imaging of the head, cervical spine, and maxillofacial structures were performed using the standard protocol without intravenous contrast. Multiplanar CT image reconstructions of the cervical spine and maxillofacial structures were also generated. COMPARISON:  12/07/2017 CT head. FINDINGS: CT HEAD FINDINGS Brain: Thin acute subdural hematoma along the falx and over the left cerebral convexity with mild mass effect on the brain resulting in 3 mm of left-to-right midline  shift. The subdural hematomas overlying the left cerebral convexity measure up to 5 mm in thickness. Small volume of subarachnoid hemorrhage predominantly over the left anterior frontal and temporal convexities, trace subarachnoid hemorrhage within the right sylvian fissure. Hemorrhagic cortical contusions within the left anterior temporal and left anterior inferior frontal lobes. No herniation. Background of mild chronic microvascular ischemic changes and parenchymal volume loss of the brain as well as small chronic lacunar infarctions within the left caudate head and lentiform nucleus. Vascular: Calcific atherosclerosis of carotid siphons. No hyperdense vessel. Skull: Nondisplaced acute fracture of the right parietal bone propagating into the right temporal squamous. 2.2 cm hematoma and contusion of the right parietal scalp. Other: None. CT MAXILLOFACIAL FINDINGS Osseous: Minimally displaced acute fracture of the right zygomatic arch. No additional acute fracture identified. Orbits: Negative. No traumatic or inflammatory finding. Sinuses: Partial opacification of the right mastoid tip with sclerosis compatible sequelae of chronic otomastoiditis. Mild left ethmoid air cell mucosal thickening. Soft tissues: Negative. CT CERVICAL SPINE FINDINGS Alignment: Mild reversal of cervical lordosis with grade 1 C4-5 anterolisthesis and associated prominent facet arthropathy. Skull base and vertebrae: No acute fracture. No primary bone lesion or focal pathologic process. Soft tissues and spinal canal: No prevertebral fluid or swelling. No visible canal hematoma. Disc levels: Osteophytosis of the anterior C1-2 joint. Discogenic degenerative changes greatest at the C4-C7 levels. Multilevel prominent facet arthropathy. Uncovertebral and facet arthropathy on the right results in neural foraminal stenosis at the C3-4 and C5-C7  levels. On the left there is neural foraminal stenosis at C3-C7. no high-grade bony canal stenosis. Upper  chest: Centrilobular emphysema of the lung apices. Other: Calcific atherosclerosis of carotid siphons. IMPRESSION: CT head: 1. Large right parietal scalp contusion. Nondisplaced acute fracture of right parietal bone extending into the right temporal squamous bone. 2. Contrecoup hemorrhagic cortical contusions of the left anterior frontal and temporal lobes. 3. Thin subdural hematoma along the falx and over left cerebral convexity. 4. Small volume subarachnoid hemorrhage predominantly over the left anterior frontal and temporal lobes. 5. Mass effect associated with thin subdural hematomas resulting in 3 mm left-to-right midline shift. No herniation. CT maxillofacial: 1. Minimally displaced acute fracture of the right zygomatic arch. 2. No additional facial fracture injury. CT cervical spine. 1. No acute fracture or dislocation of the cervical spine. 2. Moderate cervical spondylosis. 3. Centrilobular emphysema of the lungs. Critical Value/emergent results were called by telephone at the time of interpretation on 05/07/2018 at 9:01 pm to Dr. Nance Pear , who verbally acknowledged these results. Electronically Signed   By: Kristine Garbe M.D.   On: 05/29/2018 21:06   Ct Cervical Spine Wo Contrast  Result Date: 05/06/2018 CLINICAL DATA:  72 y/o M; fall with laceration and hematoma to the right scalp. EXAM: CT HEAD WITHOUT CONTRAST CT MAXILLOFACIAL WITHOUT CONTRAST CT CERVICAL SPINE WITHOUT CONTRAST TECHNIQUE: Multidetector CT imaging of the head, cervical spine, and maxillofacial structures were performed using the standard protocol without intravenous contrast. Multiplanar CT image reconstructions of the cervical spine and maxillofacial structures were also generated. COMPARISON:  12/07/2017 CT head. FINDINGS: CT HEAD FINDINGS Brain: Thin acute subdural hematoma along the falx and over the left cerebral convexity with mild mass effect on the brain resulting in 3 mm of left-to-right midline shift. The  subdural hematomas overlying the left cerebral convexity measure up to 5 mm in thickness. Small volume of subarachnoid hemorrhage predominantly over the left anterior frontal and temporal convexities, trace subarachnoid hemorrhage within the right sylvian fissure. Hemorrhagic cortical contusions within the left anterior temporal and left anterior inferior frontal lobes. No herniation. Background of mild chronic microvascular ischemic changes and parenchymal volume loss of the brain as well as small chronic lacunar infarctions within the left caudate head and lentiform nucleus. Vascular: Calcific atherosclerosis of carotid siphons. No hyperdense vessel. Skull: Nondisplaced acute fracture of the right parietal bone propagating into the right temporal squamous. 2.2 cm hematoma and contusion of the right parietal scalp. Other: None. CT MAXILLOFACIAL FINDINGS Osseous: Minimally displaced acute fracture of the right zygomatic arch. No additional acute fracture identified. Orbits: Negative. No traumatic or inflammatory finding. Sinuses: Partial opacification of the right mastoid tip with sclerosis compatible sequelae of chronic otomastoiditis. Mild left ethmoid air cell mucosal thickening. Soft tissues: Negative. CT CERVICAL SPINE FINDINGS Alignment: Mild reversal of cervical lordosis with grade 1 C4-5 anterolisthesis and associated prominent facet arthropathy. Skull base and vertebrae: No acute fracture. No primary bone lesion or focal pathologic process. Soft tissues and spinal canal: No prevertebral fluid or swelling. No visible canal hematoma. Disc levels: Osteophytosis of the anterior C1-2 joint. Discogenic degenerative changes greatest at the C4-C7 levels. Multilevel prominent facet arthropathy. Uncovertebral and facet arthropathy on the right results in neural foraminal stenosis at the C3-4 and C5-C7 levels. On the left there is neural foraminal stenosis at C3-C7. no high-grade bony canal stenosis. Upper chest:  Centrilobular emphysema of the lung apices. Other: Calcific atherosclerosis of carotid siphons. IMPRESSION: CT head: 1. Large right parietal scalp contusion.  Nondisplaced acute fracture of right parietal bone extending into the right temporal squamous bone. 2. Contrecoup hemorrhagic cortical contusions of the left anterior frontal and temporal lobes. 3. Thin subdural hematoma along the falx and over left cerebral convexity. 4. Small volume subarachnoid hemorrhage predominantly over the left anterior frontal and temporal lobes. 5. Mass effect associated with thin subdural hematomas resulting in 3 mm left-to-right midline shift. No herniation. CT maxillofacial: 1. Minimally displaced acute fracture of the right zygomatic arch. 2. No additional facial fracture injury. CT cervical spine. 1. No acute fracture or dislocation of the cervical spine. 2. Moderate cervical spondylosis. 3. Centrilobular emphysema of the lungs. Critical Value/emergent results were called by telephone at the time of interpretation on 05/31/2018 at 9:01 pm to Dr. Nance Pear , who verbally acknowledged these results. Electronically Signed   By: Kristine Garbe M.D.   On: 05/26/2018 21:06   Ct Maxillofacial Wo Contrast  Result Date: 06/03/2018 CLINICAL DATA:  72 y/o M; fall with laceration and hematoma to the right scalp. EXAM: CT HEAD WITHOUT CONTRAST CT MAXILLOFACIAL WITHOUT CONTRAST CT CERVICAL SPINE WITHOUT CONTRAST TECHNIQUE: Multidetector CT imaging of the head, cervical spine, and maxillofacial structures were performed using the standard protocol without intravenous contrast. Multiplanar CT image reconstructions of the cervical spine and maxillofacial structures were also generated. COMPARISON:  12/07/2017 CT head. FINDINGS: CT HEAD FINDINGS Brain: Thin acute subdural hematoma along the falx and over the left cerebral convexity with mild mass effect on the brain resulting in 3 mm of left-to-right midline shift. The subdural  hematomas overlying the left cerebral convexity measure up to 5 mm in thickness. Small volume of subarachnoid hemorrhage predominantly over the left anterior frontal and temporal convexities, trace subarachnoid hemorrhage within the right sylvian fissure. Hemorrhagic cortical contusions within the left anterior temporal and left anterior inferior frontal lobes. No herniation. Background of mild chronic microvascular ischemic changes and parenchymal volume loss of the brain as well as small chronic lacunar infarctions within the left caudate head and lentiform nucleus. Vascular: Calcific atherosclerosis of carotid siphons. No hyperdense vessel. Skull: Nondisplaced acute fracture of the right parietal bone propagating into the right temporal squamous. 2.2 cm hematoma and contusion of the right parietal scalp. Other: None. CT MAXILLOFACIAL FINDINGS Osseous: Minimally displaced acute fracture of the right zygomatic arch. No additional acute fracture identified. Orbits: Negative. No traumatic or inflammatory finding. Sinuses: Partial opacification of the right mastoid tip with sclerosis compatible sequelae of chronic otomastoiditis. Mild left ethmoid air cell mucosal thickening. Soft tissues: Negative. CT CERVICAL SPINE FINDINGS Alignment: Mild reversal of cervical lordosis with grade 1 C4-5 anterolisthesis and associated prominent facet arthropathy. Skull base and vertebrae: No acute fracture. No primary bone lesion or focal pathologic process. Soft tissues and spinal canal: No prevertebral fluid or swelling. No visible canal hematoma. Disc levels: Osteophytosis of the anterior C1-2 joint. Discogenic degenerative changes greatest at the C4-C7 levels. Multilevel prominent facet arthropathy. Uncovertebral and facet arthropathy on the right results in neural foraminal stenosis at the C3-4 and C5-C7 levels. On the left there is neural foraminal stenosis at C3-C7. no high-grade bony canal stenosis. Upper chest: Centrilobular  emphysema of the lung apices. Other: Calcific atherosclerosis of carotid siphons. IMPRESSION: CT head: 1. Large right parietal scalp contusion. Nondisplaced acute fracture of right parietal bone extending into the right temporal squamous bone. 2. Contrecoup hemorrhagic cortical contusions of the left anterior frontal and temporal lobes. 3. Thin subdural hematoma along the falx and over left cerebral convexity. 4. Small  volume subarachnoid hemorrhage predominantly over the left anterior frontal and temporal lobes. 5. Mass effect associated with thin subdural hematomas resulting in 3 mm left-to-right midline shift. No herniation. CT maxillofacial: 1. Minimally displaced acute fracture of the right zygomatic arch. 2. No additional facial fracture injury. CT cervical spine. 1. No acute fracture or dislocation of the cervical spine. 2. Moderate cervical spondylosis. 3. Centrilobular emphysema of the lungs. Critical Value/emergent results were called by telephone at the time of interpretation on 05/26/2018 at 9:01 pm to Dr. Nance Pear , who verbally acknowledged these results. Electronically Signed   By: Kristine Garbe M.D.   On: 05/14/2018 21:06    ASSESSMENT / PLAN: Acute parietal fracture, intraparenchymal hematoma, and subdural hematoma s/p fall  Wide-complex tachycardia  Acute on chronic renal failure  Acute pain  Anemia  Thrombocytopenia Hx: Atrial Fibrillation, CHF, HTN, Dementia, and CAD  P: Supplemental O2 for dyspnea and/or hypoxia  Continuous telemetry monitoring  Continue amiodarone gtt  Maintain map >65 VTE px: SCD's, avoid chemical prophylaxis  Trend CBC  Monitor for s/sx of bleeding and transfuse for hgb <7 Trend BMP  Replace electrolytes as indicated  Monitor UOP  Prn morphine for pain management  Sitter at bedside  Palliative care consulted appreciate input   -After further discussion with pts wife she does NOT want pt to receive aggressive care (she does NOT want  any form of vasopressors or Bipap) and she confirmed DNR code status.  She is very tearful stating she has been married to Mr. Reisig for over 40 yrs and informs me they do not have any children.  She also states the pt has declined and has had a poor quality of life over the past several months with poor po intake, weight loss, and confusion.  She would like the pt to be discharged to [facility] and focus on comfort.  Therefore, I will not order repeat CT Head and will order Palliative Care Consult to assist with Mrs. Warrens wishes.    Marda Stalker, [place] Pager [phone] (please enter 7 digits) PCCM Consult Pager [phone] (please enter 7 digits)

## 2018-06-05 NOTE — ED Notes (Signed)
Pt continues to pull [place] O2 off sats 84% , attempting NRB pt not tolerating NRB , Sitter present to do blow by O2.   Prime Doc aware , changing  bed status to stepdown

## 2018-06-05 NOTE — Progress Notes (Signed)
Initial call made to [facility] to inform of comfort care patient.  Spoke with Murlean Caller, referral number [phone]. Will call back at time of expiration.

## 2018-06-05 NOTE — Consult Note (Signed)
I came in to see Mr. Alan Bennett.  He has been transitioned to comfort measures.  I agree with this decision and defer further decisionmaking to the ICU and hospital staff.  I offered my condolences to Mrs. Alan Bennett.  Meade Maw

## 2018-06-05 NOTE — Progress Notes (Signed)
Alan Bennett at [facility] NAME: Alan Bennett    MR#:  [id number]  DATE OF BIRTH:  01/26/1946  SUBJECTIVE:  CHIEF COMPLAINT:   Chief Complaint  Patient presents with  . Fall  Patient seen and evaluated today Unresponsive Lying on the bed  REVIEW OF SYSTEMS:    ROS Could not be obtained as patient is unresponsive DRUG ALLERGIES:  No Known Allergies  VITALS:  Blood pressure 114/75, pulse (!) 43, temperature 98.1 F (36.7 C), temperature source Oral, resp. rate (!) 24, height 6\' 1"  (1.854 m), weight 144 lb 2.9 oz (65.4 kg), SpO2 (!) 79 %.  PHYSICAL EXAMINATION:   Physical Exam  GENERAL:  72 y.o.-year-old patient lying in the bed  EYES: Pupils equal, round, reactive to light and accommodation. No scleral icterus. Extraocular muscles intact.  HEENT: Head atraumatic, normocephalic. Oropharynx and nasopharynx clear.  NECK:  Supple, no jugular venous distention. No thyroid enlargement, no tenderness.  LUNGS: Normal breath sounds bilaterally, no wheezing, rales, rhonchi. No use of accessory muscles of respiration.  CARDIOVASCULAR: S1, S2 normal. No murmurs, rubs, or gallops.  ABDOMEN: Soft, nontender, nondistended. Bowel sounds present. No organomegaly or mass.  EXTREMITIES: No cyanosis, clubbing or edema b/l.    NEUROLOGIC: Not oriented to time place and person with decreased responsiveness PSYCHIATRIC: Not be assessed.  SKIN: No obvious rash, lesion, or ulcer.   LABORATORY PANEL:   CBC Recent Labs  Lab 06/05/18 0156  WBC 25.2*  HGB 9.9*  HCT 30.5*  PLT 211   ------------------------------------------------------------------------------------------------------------------ Chemistries  Recent Labs  Lab 06/05/18 0156  NA 141  K 4.2  CL 112*  CO2 15*  GLUCOSE 213*  BUN 24*  CREATININE 2.18*  CALCIUM 8.2*    ------------------------------------------------------------------------------------------------------------------  Cardiac Enzymes No results for input(s): TROPONINI in the last 168 hours. ------------------------------------------------------------------------------------------------------------------  RADIOLOGY:  Ct Head Wo Contrast  Result Date: 05/10/2018 CLINICAL DATA:  72 y/o M; fall with laceration and hematoma to the right scalp. EXAM: CT HEAD WITHOUT CONTRAST CT MAXILLOFACIAL WITHOUT CONTRAST CT CERVICAL SPINE WITHOUT CONTRAST TECHNIQUE: Multidetector CT imaging of the head, cervical spine, and maxillofacial structures were performed using the standard protocol without intravenous contrast. Multiplanar CT image reconstructions of the cervical spine and maxillofacial structures were also generated. COMPARISON:  12/07/2017 CT head. FINDINGS: CT HEAD FINDINGS Brain: Thin acute subdural hematoma along the falx and over the left cerebral convexity with mild mass effect on the brain resulting in 3 mm of left-to-right midline shift. The subdural hematomas overlying the left cerebral convexity measure up to 5 mm in thickness. Small volume of subarachnoid hemorrhage predominantly over the left anterior frontal and temporal convexities, trace subarachnoid hemorrhage within the right sylvian fissure. Hemorrhagic cortical contusions within the left anterior temporal and left anterior inferior frontal lobes. No herniation. Background of mild chronic microvascular ischemic changes and parenchymal volume loss of the brain as well as small chronic lacunar infarctions within the left caudate head and lentiform nucleus. Vascular: Calcific atherosclerosis of carotid siphons. No hyperdense vessel. Skull: Nondisplaced acute fracture of the right parietal bone propagating into the right temporal squamous. 2.2 cm hematoma and contusion of the right parietal scalp. Other: None. CT MAXILLOFACIAL FINDINGS Osseous:  Minimally displaced acute fracture of the right zygomatic arch. No additional acute fracture identified. Orbits: Negative. No traumatic or inflammatory finding. Sinuses: Partial opacification of the right mastoid tip with sclerosis compatible sequelae of chronic otomastoiditis. Mild left ethmoid air cell mucosal thickening. Soft tissues: Negative.  CT CERVICAL SPINE FINDINGS Alignment: Mild reversal of cervical lordosis with grade 1 C4-5 anterolisthesis and associated prominent facet arthropathy. Skull base and vertebrae: No acute fracture. No primary bone lesion or focal pathologic process. Soft tissues and spinal canal: No prevertebral fluid or swelling. No visible canal hematoma. Disc levels: Osteophytosis of the anterior C1-2 joint. Discogenic degenerative changes greatest at the C4-C7 levels. Multilevel prominent facet arthropathy. Uncovertebral and facet arthropathy on the right results in neural foraminal stenosis at the C3-4 and C5-C7 levels. On the left there is neural foraminal stenosis at C3-C7. no high-grade bony canal stenosis. Upper chest: Centrilobular emphysema of the lung apices. Other: Calcific atherosclerosis of carotid siphons. IMPRESSION: CT head: 1. Large right parietal scalp contusion. Nondisplaced acute fracture of right parietal bone extending into the right temporal squamous bone. 2. Contrecoup hemorrhagic cortical contusions of the left anterior frontal and temporal lobes. 3. Thin subdural hematoma along the falx and over left cerebral convexity. 4. Small volume subarachnoid hemorrhage predominantly over the left anterior frontal and temporal lobes. 5. Mass effect associated with thin subdural hematomas resulting in 3 mm left-to-right midline shift. No herniation. CT maxillofacial: 1. Minimally displaced acute fracture of the right zygomatic arch. 2. No additional facial fracture injury. CT cervical spine. 1. No acute fracture or dislocation of the cervical spine. 2. Moderate cervical  spondylosis. 3. Centrilobular emphysema of the lungs. Critical Value/emergent results were called by telephone at the time of interpretation on 05/13/2018 at 9:01 pm to Dr. Nance Pear , who verbally acknowledged these results. Electronically Signed   By: Kristine Garbe M.D.   On: 06/03/2018 21:06   Ct Cervical Spine Wo Contrast  Result Date: 05/27/2018 CLINICAL DATA:  72 y/o M; fall with laceration and hematoma to the right scalp. EXAM: CT HEAD WITHOUT CONTRAST CT MAXILLOFACIAL WITHOUT CONTRAST CT CERVICAL SPINE WITHOUT CONTRAST TECHNIQUE: Multidetector CT imaging of the head, cervical spine, and maxillofacial structures were performed using the standard protocol without intravenous contrast. Multiplanar CT image reconstructions of the cervical spine and maxillofacial structures were also generated. COMPARISON:  12/07/2017 CT head. FINDINGS: CT HEAD FINDINGS Brain: Thin acute subdural hematoma along the falx and over the left cerebral convexity with mild mass effect on the brain resulting in 3 mm of left-to-right midline shift. The subdural hematomas overlying the left cerebral convexity measure up to 5 mm in thickness. Small volume of subarachnoid hemorrhage predominantly over the left anterior frontal and temporal convexities, trace subarachnoid hemorrhage within the right sylvian fissure. Hemorrhagic cortical contusions within the left anterior temporal and left anterior inferior frontal lobes. No herniation. Background of mild chronic microvascular ischemic changes and parenchymal volume loss of the brain as well as small chronic lacunar infarctions within the left caudate head and lentiform nucleus. Vascular: Calcific atherosclerosis of carotid siphons. No hyperdense vessel. Skull: Nondisplaced acute fracture of the right parietal bone propagating into the right temporal squamous. 2.2 cm hematoma and contusion of the right parietal scalp. Other: None. CT MAXILLOFACIAL FINDINGS Osseous:  Minimally displaced acute fracture of the right zygomatic arch. No additional acute fracture identified. Orbits: Negative. No traumatic or inflammatory finding. Sinuses: Partial opacification of the right mastoid tip with sclerosis compatible sequelae of chronic otomastoiditis. Mild left ethmoid air cell mucosal thickening. Soft tissues: Negative. CT CERVICAL SPINE FINDINGS Alignment: Mild reversal of cervical lordosis with grade 1 C4-5 anterolisthesis and associated prominent facet arthropathy. Skull base and vertebrae: No acute fracture. No primary bone lesion or focal pathologic process. Soft tissues and spinal canal:  No prevertebral fluid or swelling. No visible canal hematoma. Disc levels: Osteophytosis of the anterior C1-2 joint. Discogenic degenerative changes greatest at the C4-C7 levels. Multilevel prominent facet arthropathy. Uncovertebral and facet arthropathy on the right results in neural foraminal stenosis at the C3-4 and C5-C7 levels. On the left there is neural foraminal stenosis at C3-C7. no high-grade bony canal stenosis. Upper chest: Centrilobular emphysema of the lung apices. Other: Calcific atherosclerosis of carotid siphons. IMPRESSION: CT head: 1. Large right parietal scalp contusion. Nondisplaced acute fracture of right parietal bone extending into the right temporal squamous bone. 2. Contrecoup hemorrhagic cortical contusions of the left anterior frontal and temporal lobes. 3. Thin subdural hematoma along the falx and over left cerebral convexity. 4. Small volume subarachnoid hemorrhage predominantly over the left anterior frontal and temporal lobes. 5. Mass effect associated with thin subdural hematomas resulting in 3 mm left-to-right midline shift. No herniation. CT maxillofacial: 1. Minimally displaced acute fracture of the right zygomatic arch. 2. No additional facial fracture injury. CT cervical spine. 1. No acute fracture or dislocation of the cervical spine. 2. Moderate cervical  spondylosis. 3. Centrilobular emphysema of the lungs. Critical Value/emergent results were called by telephone at the time of interpretation on 05/06/2018 at 9:01 pm to Dr. Nance Pear , who verbally acknowledged these results. Electronically Signed   By: Kristine Garbe M.D.   On: 05/15/2018 21:06   Ct Maxillofacial Wo Contrast  Result Date: 05/23/2018 CLINICAL DATA:  72 y/o M; fall with laceration and hematoma to the right scalp. EXAM: CT HEAD WITHOUT CONTRAST CT MAXILLOFACIAL WITHOUT CONTRAST CT CERVICAL SPINE WITHOUT CONTRAST TECHNIQUE: Multidetector CT imaging of the head, cervical spine, and maxillofacial structures were performed using the standard protocol without intravenous contrast. Multiplanar CT image reconstructions of the cervical spine and maxillofacial structures were also generated. COMPARISON:  12/07/2017 CT head. FINDINGS: CT HEAD FINDINGS Brain: Thin acute subdural hematoma along the falx and over the left cerebral convexity with mild mass effect on the brain resulting in 3 mm of left-to-right midline shift. The subdural hematomas overlying the left cerebral convexity measure up to 5 mm in thickness. Small volume of subarachnoid hemorrhage predominantly over the left anterior frontal and temporal convexities, trace subarachnoid hemorrhage within the right sylvian fissure. Hemorrhagic cortical contusions within the left anterior temporal and left anterior inferior frontal lobes. No herniation. Background of mild chronic microvascular ischemic changes and parenchymal volume loss of the brain as well as small chronic lacunar infarctions within the left caudate head and lentiform nucleus. Vascular: Calcific atherosclerosis of carotid siphons. No hyperdense vessel. Skull: Nondisplaced acute fracture of the right parietal bone propagating into the right temporal squamous. 2.2 cm hematoma and contusion of the right parietal scalp. Other: None. CT MAXILLOFACIAL FINDINGS Osseous:  Minimally displaced acute fracture of the right zygomatic arch. No additional acute fracture identified. Orbits: Negative. No traumatic or inflammatory finding. Sinuses: Partial opacification of the right mastoid tip with sclerosis compatible sequelae of chronic otomastoiditis. Mild left ethmoid air cell mucosal thickening. Soft tissues: Negative. CT CERVICAL SPINE FINDINGS Alignment: Mild reversal of cervical lordosis with grade 1 C4-5 anterolisthesis and associated prominent facet arthropathy. Skull base and vertebrae: No acute fracture. No primary bone lesion or focal pathologic process. Soft tissues and spinal canal: No prevertebral fluid or swelling. No visible canal hematoma. Disc levels: Osteophytosis of the anterior C1-2 joint. Discogenic degenerative changes greatest at the C4-C7 levels. Multilevel prominent facet arthropathy. Uncovertebral and facet arthropathy on the right results in neural foraminal stenosis  at the C3-4 and C5-C7 levels. On the left there is neural foraminal stenosis at C3-C7. no high-grade bony canal stenosis. Upper chest: Centrilobular emphysema of the lung apices. Other: Calcific atherosclerosis of carotid siphons. IMPRESSION: CT head: 1. Large right parietal scalp contusion. Nondisplaced acute fracture of right parietal bone extending into the right temporal squamous bone. 2. Contrecoup hemorrhagic cortical contusions of the left anterior frontal and temporal lobes. 3. Thin subdural hematoma along the falx and over left cerebral convexity. 4. Small volume subarachnoid hemorrhage predominantly over the left anterior frontal and temporal lobes. 5. Mass effect associated with thin subdural hematomas resulting in 3 mm left-to-right midline shift. No herniation. CT maxillofacial: 1. Minimally displaced acute fracture of the right zygomatic arch. 2. No additional facial fracture injury. CT cervical spine. 1. No acute fracture or dislocation of the cervical spine. 2. Moderate cervical  spondylosis. 3. Centrilobular emphysema of the lungs. Critical Value/emergent results were called by telephone at the time of interpretation on 06/03/2018 at 9:01 pm to Dr. Nance Pear , who verbally acknowledged these results. Electronically Signed   By: Kristine Garbe M.D.   On: 05/11/2018 21:06     ASSESSMENT AND PLAN:   -Acute encephalopathy Secondary to intracranial hemorrhage and hepatic encephalopathy Currently under comfort measures   subdural hematoma secondary to trauma from fall Patient is comfort care  no aggressive intervention requested by family  -Subarachnoid hemorrhage secondary to trauma from fall Currently under comfort measures  -Overall prognosis poor  All the records are reviewed and case discussed with Care Management/Social Worker. Management plans discussed with the patient, family and they are in agreement.  CODE STATUS: DNR  DVT Prophylaxis: SCDs  TOTAL TIME TAKING CARE OF THIS PATIENT: 22 minutes.   POSSIBLE D/C IN 1 to 2 DAYS, DEPENDING ON CLINICAL CONDITION.  Saundra Shelling M.D on 06/05/2018 at 1:24 PM  Between 7am to 6pm - Pager - [phone]  After 6pm go to [email or web address] - password [email or web address] [facility]  Office  [phone]  CC: Primary care physician; Baxter Hire, MD  Note: This dictation was prepared with Dragon dictation along with smaller phrase technology. Any transcriptional errors that result from this process are unintentional.

## 2018-06-05 NOTE — ED Notes (Signed)
Pt becoming increasingly agitated, Admit MD notified new orders received , family remains at bedside, sitter at bedside

## 2018-06-05 NOTE — Progress Notes (Signed)
Patient transitioned to comfort care. Morphine drip initiated. Precedex discontinued. Amiodarone discontinued. Wife holding patient's hand at bedside. Support given to patient's wife.

## 2018-06-05 NOTE — Progress Notes (Signed)
0200- Upon arrival to unit patient was agitated, anxious and unable to be redirected. Unable to complete a neuro exam on patient as he was only able to tell me his first name.  Initially unable to obtain a temperature on patient. Bear hugger applied, however patient would not keep it on. Mitts applied as patient was tearing off gown, EKG strips and pulling at IVs. IVs wrapped with coban for protection. Patient on [facility]. Per wife's wishes patient is not to have any type of pressors or breathing mask. Patient is on Precedex at 76mcg/kg/min. Has had 2 doses of morphine PRN and 2.5mg  of haldol. Patient has finally relaxed and is resting in bed without flailing around and attempting to get out of bed. BP currently 73/46, heart rate 64 with irregular rhythms, O2 sat is 86%. Respirations are 26. Wife remains solemn at bedside.

## 2018-06-05 NOTE — Progress Notes (Signed)
Notified by RN pt hypotensive bp 66/47 and O2 sats 68% on 6L via nasal canula.  I spoke with pts wife regarding decline in pts condition and informed her I do not think the pt will be able to transport to hospice home.  I discussed transitioning pt to full comfort care in the hospital.  She agreed with transitioning the pt to comfort care measures only now.  I will order a morphine gtt with prn boluses and discontinue amiodarone gtt.    Marda Stalker, [place] Pager [phone] (please enter 7 digits) PCCM Consult Pager [phone] (please enter 7 digits)

## 2018-06-05 NOTE — Progress Notes (Signed)
Patient changed to comfort care status and wife asking questions about God's involvement in their lives at this point. Other family members at the bedside. Chaplain provided spiritual support to wife, brother, nephew, and sister-in-law of patient.    06/05/18 0649  Clinical Encounter Type  Visited With Family  Visit Type Follow-up;Spiritual support;Patient actively dying  Referral From Nurse

## 2018-06-05 DEATH — deceased

## 2018-06-06 MED ORDER — SCOPOLAMINE 1 MG/3DAYS TD PT72
1.0000 | MEDICATED_PATCH | TRANSDERMAL | Status: DC
  Filled 2018-06-06: qty 1

## 2018-06-06 MED ORDER — SCOPOLAMINE 1 MG/3DAYS TD PT72
1.0000 | MEDICATED_PATCH | TRANSDERMAL | Status: DC
  Administered 2018-06-06: 1.5 mg via TRANSDERMAL
  Filled 2018-06-06: qty 1

## 2018-06-16 ENCOUNTER — Encounter (INDEPENDENT_AMBULATORY_CARE_PROVIDER_SITE_OTHER): Payer: Medicare Other

## 2018-06-16 ENCOUNTER — Ambulatory Visit (INDEPENDENT_AMBULATORY_CARE_PROVIDER_SITE_OTHER): Payer: Medicare Other | Admitting: Vascular Surgery

## 2018-06-26 ENCOUNTER — Ambulatory Visit: Payer: Medicare Other | Admitting: Oncology

## 2018-06-26 ENCOUNTER — Other Ambulatory Visit: Payer: Medicare Other

## 2018-07-06 NOTE — Progress Notes (Signed)
New hospice home referral received from [facility]. Patient is a 72 year old man with a history of dementia who was admitted to [facility] following a fall in which he suffered an acute parietal fracture, intraparenchymal hematoma and subdural hematoma. No neurosurgical intervention. Patient was transitioned to comfort care and started on a morphine drip. He is currently on 6 mg/hr and receiving IV robinul for secretions. Writer met in the room with patient's wife Star. Patient appears to be too fragile for transport. Much emotional support and education given. Chaplin paged and provided support. Referral faxed for documentation purposes. Writer will continue to provide support through out the day. Hospital team updated as to patient's fragile condition.  Flo Shanks RN, BSN, [place] [facility] [place], [facility] [phone]

## 2018-07-06 NOTE — Progress Notes (Signed)
   07/06/18 1323  Clinical Encounter Type  Visited With Patient and family together  Visit Type Follow-up;Spiritual support   Chaplain requested by Hospice Nurse Santiago Glad to provide support to wife of patient.  Provided active listening as wife told the story of their life together; discussed some of the ways grief impacts [place].  Wife was very concerned that her husband was suffering, although he was actually sleeping at the moment.   Chaplain explored grief with wife of patient, explained that even though her husband is not suffering right now, her own grief is causing her to suffer as she deals with her loss.  Chaplain prayed with wife and nurse, and encouraged wife to request on-call Chaplain if a return visit would be helpful.

## 2018-07-06 NOTE — Progress Notes (Signed)
[facility] NAME: Alan Bennett    MR#:  [id number]  DATE OF BIRTH:  1946/03/14  SUBJECTIVE:  CHIEF COMPLAINT:   Chief Complaint  Patient presents with  . Fall  Patient seen and evaluated today Unresponsive Lying on the bed Lot of gurgling sounds  REVIEW OF SYSTEMS:    ROS Could not be obtained as patient is unresponsive DRUG ALLERGIES:  No Known Allergies  VITALS:  Blood pressure 114/75, pulse (!) 43, temperature 98.1 F (36.7 C), temperature source Oral, resp. rate 15, height 6\' 1"  (1.854 m), weight 144 lb 2.9 oz (65.4 kg), SpO2 (!) 79 %.  PHYSICAL EXAMINATION:   Physical Exam  GENERAL:  72 y.o.-year-old patient lying in the bed on oxygen via nasal canula EYES: Pupils equal, round, reactive to light and accommodation. No scleral icterus. Extraocular muscles intact.  HEENT: Head atraumatic, normocephalic. Oropharynx and nasopharynx clear.  NECK:  Supple, no jugular venous distention. No thyroid enlargement, no tenderness.  LUNGS: Decreased breath sounds bilaterally,bibasilar crepitations heard. No use of accessory muscles of respiration.  CARDIOVASCULAR: S1, S2 normal. No murmurs, rubs, or gallops.  ABDOMEN: Soft, nontender, nondistended. Bowel sounds present. No organomegaly or mass.  EXTREMITIES: No cyanosis, clubbing or edema b/l.    NEUROLOGIC: Not oriented to time place and person with decreased responsiveness PSYCHIATRIC: Not be assessed.  SKIN: No obvious rash, lesion, or ulcer.   LABORATORY PANEL:   CBC Recent Labs  Lab 06/05/18 0156  WBC 25.2*  HGB 9.9*  HCT 30.5*  PLT 211   ------------------------------------------------------------------------------------------------------------------ Chemistries  Recent Labs  Lab 06/05/18 0156  NA 141  K 4.2  CL 112*  CO2 15*  GLUCOSE 213*  BUN 24*  CREATININE 2.18*  CALCIUM 8.2*    ------------------------------------------------------------------------------------------------------------------  Cardiac Enzymes No results for input(s): TROPONINI in the last 168 hours. ------------------------------------------------------------------------------------------------------------------  RADIOLOGY:  Ct Head Wo Contrast  Result Date: 05/22/2018 CLINICAL DATA:  72 y/o M; fall with laceration and hematoma to the right scalp. EXAM: CT HEAD WITHOUT CONTRAST CT MAXILLOFACIAL WITHOUT CONTRAST CT CERVICAL SPINE WITHOUT CONTRAST TECHNIQUE: Multidetector CT imaging of the head, cervical spine, and maxillofacial structures were performed using the standard protocol without intravenous contrast. Multiplanar CT image reconstructions of the cervical spine and maxillofacial structures were also generated. COMPARISON:  12/07/2017 CT head. FINDINGS: CT HEAD FINDINGS Brain: Thin acute subdural hematoma along the falx and over the left cerebral convexity with mild mass effect on the brain resulting in 3 mm of left-to-right midline shift. The subdural hematomas overlying the left cerebral convexity measure up to 5 mm in thickness. Small volume of subarachnoid hemorrhage predominantly over the left anterior frontal and temporal convexities, trace subarachnoid hemorrhage within the right sylvian fissure. Hemorrhagic cortical contusions within the left anterior temporal and left anterior inferior frontal lobes. No herniation. Background of mild chronic microvascular ischemic changes and parenchymal volume loss of the brain as well as small chronic lacunar infarctions within the left caudate head and lentiform nucleus. Vascular: Calcific atherosclerosis of carotid siphons. No hyperdense vessel. Skull: Nondisplaced acute fracture of the right parietal bone propagating into the right temporal squamous. 2.2 cm hematoma and contusion of the right parietal scalp. Other: None. CT MAXILLOFACIAL FINDINGS Osseous:  Minimally displaced acute fracture of the right zygomatic arch. No additional acute fracture identified. Orbits: Negative. No traumatic or inflammatory finding. Sinuses: Partial opacification of the right mastoid tip with sclerosis compatible sequelae of chronic otomastoiditis. Mild left ethmoid air cell  mucosal thickening. Soft tissues: Negative. CT CERVICAL SPINE FINDINGS Alignment: Mild reversal of cervical lordosis with grade 1 C4-5 anterolisthesis and associated prominent facet arthropathy. Skull base and vertebrae: No acute fracture. No primary bone lesion or focal pathologic process. Soft tissues and spinal canal: No prevertebral fluid or swelling. No visible canal hematoma. Disc levels: Osteophytosis of the anterior C1-2 joint. Discogenic degenerative changes greatest at the C4-C7 levels. Multilevel prominent facet arthropathy. Uncovertebral and facet arthropathy on the right results in neural foraminal stenosis at the C3-4 and C5-C7 levels. On the left there is neural foraminal stenosis at C3-C7. no high-grade bony canal stenosis. Upper chest: Centrilobular emphysema of the lung apices. Other: Calcific atherosclerosis of carotid siphons. IMPRESSION: CT head: 1. Large right parietal scalp contusion. Nondisplaced acute fracture of right parietal bone extending into the right temporal squamous bone. 2. Contrecoup hemorrhagic cortical contusions of the left anterior frontal and temporal lobes. 3. Thin subdural hematoma along the falx and over left cerebral convexity. 4. Small volume subarachnoid hemorrhage predominantly over the left anterior frontal and temporal lobes. 5. Mass effect associated with thin subdural hematomas resulting in 3 mm left-to-right midline shift. No herniation. CT maxillofacial: 1. Minimally displaced acute fracture of the right zygomatic arch. 2. No additional facial fracture injury. CT cervical spine. 1. No acute fracture or dislocation of the cervical spine. 2. Moderate cervical  spondylosis. 3. Centrilobular emphysema of the lungs. Critical Value/emergent results were called by telephone at the time of interpretation on 05/15/2018 at 9:01 pm to Dr. Nance Pear , who verbally acknowledged these results. Electronically Signed   By: Kristine Garbe M.D.   On: 05/11/2018 21:06   Ct Cervical Spine Wo Contrast  Result Date: 06/03/2018 CLINICAL DATA:  72 y/o M; fall with laceration and hematoma to the right scalp. EXAM: CT HEAD WITHOUT CONTRAST CT MAXILLOFACIAL WITHOUT CONTRAST CT CERVICAL SPINE WITHOUT CONTRAST TECHNIQUE: Multidetector CT imaging of the head, cervical spine, and maxillofacial structures were performed using the standard protocol without intravenous contrast. Multiplanar CT image reconstructions of the cervical spine and maxillofacial structures were also generated. COMPARISON:  12/07/2017 CT head. FINDINGS: CT HEAD FINDINGS Brain: Thin acute subdural hematoma along the falx and over the left cerebral convexity with mild mass effect on the brain resulting in 3 mm of left-to-right midline shift. The subdural hematomas overlying the left cerebral convexity measure up to 5 mm in thickness. Small volume of subarachnoid hemorrhage predominantly over the left anterior frontal and temporal convexities, trace subarachnoid hemorrhage within the right sylvian fissure. Hemorrhagic cortical contusions within the left anterior temporal and left anterior inferior frontal lobes. No herniation. Background of mild chronic microvascular ischemic changes and parenchymal volume loss of the brain as well as small chronic lacunar infarctions within the left caudate head and lentiform nucleus. Vascular: Calcific atherosclerosis of carotid siphons. No hyperdense vessel. Skull: Nondisplaced acute fracture of the right parietal bone propagating into the right temporal squamous. 2.2 cm hematoma and contusion of the right parietal scalp. Other: None. CT MAXILLOFACIAL FINDINGS Osseous:  Minimally displaced acute fracture of the right zygomatic arch. No additional acute fracture identified. Orbits: Negative. No traumatic or inflammatory finding. Sinuses: Partial opacification of the right mastoid tip with sclerosis compatible sequelae of chronic otomastoiditis. Mild left ethmoid air cell mucosal thickening. Soft tissues: Negative. CT CERVICAL SPINE FINDINGS Alignment: Mild reversal of cervical lordosis with grade 1 C4-5 anterolisthesis and associated prominent facet arthropathy. Skull base and vertebrae: No acute fracture. No primary bone lesion or focal pathologic process.  Soft tissues and spinal canal: No prevertebral fluid or swelling. No visible canal hematoma. Disc levels: Osteophytosis of the anterior C1-2 joint. Discogenic degenerative changes greatest at the C4-C7 levels. Multilevel prominent facet arthropathy. Uncovertebral and facet arthropathy on the right results in neural foraminal stenosis at the C3-4 and C5-C7 levels. On the left there is neural foraminal stenosis at C3-C7. no high-grade bony canal stenosis. Upper chest: Centrilobular emphysema of the lung apices. Other: Calcific atherosclerosis of carotid siphons. IMPRESSION: CT head: 1. Large right parietal scalp contusion. Nondisplaced acute fracture of right parietal bone extending into the right temporal squamous bone. 2. Contrecoup hemorrhagic cortical contusions of the left anterior frontal and temporal lobes. 3. Thin subdural hematoma along the falx and over left cerebral convexity. 4. Small volume subarachnoid hemorrhage predominantly over the left anterior frontal and temporal lobes. 5. Mass effect associated with thin subdural hematomas resulting in 3 mm left-to-right midline shift. No herniation. CT maxillofacial: 1. Minimally displaced acute fracture of the right zygomatic arch. 2. No additional facial fracture injury. CT cervical spine. 1. No acute fracture or dislocation of the cervical spine. 2. Moderate cervical  spondylosis. 3. Centrilobular emphysema of the lungs. Critical Value/emergent results were called by telephone at the time of interpretation on 05/28/2018 at 9:01 pm to Dr. Nance Pear , who verbally acknowledged these results. Electronically Signed   By: Kristine Garbe M.D.   On: 05/10/2018 21:06   Ct Maxillofacial Wo Contrast  Result Date: 06/01/2018 CLINICAL DATA:  72 y/o M; fall with laceration and hematoma to the right scalp. EXAM: CT HEAD WITHOUT CONTRAST CT MAXILLOFACIAL WITHOUT CONTRAST CT CERVICAL SPINE WITHOUT CONTRAST TECHNIQUE: Multidetector CT imaging of the head, cervical spine, and maxillofacial structures were performed using the standard protocol without intravenous contrast. Multiplanar CT image reconstructions of the cervical spine and maxillofacial structures were also generated. COMPARISON:  12/07/2017 CT head. FINDINGS: CT HEAD FINDINGS Brain: Thin acute subdural hematoma along the falx and over the left cerebral convexity with mild mass effect on the brain resulting in 3 mm of left-to-right midline shift. The subdural hematomas overlying the left cerebral convexity measure up to 5 mm in thickness. Small volume of subarachnoid hemorrhage predominantly over the left anterior frontal and temporal convexities, trace subarachnoid hemorrhage within the right sylvian fissure. Hemorrhagic cortical contusions within the left anterior temporal and left anterior inferior frontal lobes. No herniation. Background of mild chronic microvascular ischemic changes and parenchymal volume loss of the brain as well as small chronic lacunar infarctions within the left caudate head and lentiform nucleus. Vascular: Calcific atherosclerosis of carotid siphons. No hyperdense vessel. Skull: Nondisplaced acute fracture of the right parietal bone propagating into the right temporal squamous. 2.2 cm hematoma and contusion of the right parietal scalp. Other: None. CT MAXILLOFACIAL FINDINGS Osseous:  Minimally displaced acute fracture of the right zygomatic arch. No additional acute fracture identified. Orbits: Negative. No traumatic or inflammatory finding. Sinuses: Partial opacification of the right mastoid tip with sclerosis compatible sequelae of chronic otomastoiditis. Mild left ethmoid air cell mucosal thickening. Soft tissues: Negative. CT CERVICAL SPINE FINDINGS Alignment: Mild reversal of cervical lordosis with grade 1 C4-5 anterolisthesis and associated prominent facet arthropathy. Skull base and vertebrae: No acute fracture. No primary bone lesion or focal pathologic process. Soft tissues and spinal canal: No prevertebral fluid or swelling. No visible canal hematoma. Disc levels: Osteophytosis of the anterior C1-2 joint. Discogenic degenerative changes greatest at the C4-C7 levels. Multilevel prominent facet arthropathy. Uncovertebral and facet arthropathy on the right  results in neural foraminal stenosis at the C3-4 and C5-C7 levels. On the left there is neural foraminal stenosis at C3-C7. no high-grade bony canal stenosis. Upper chest: Centrilobular emphysema of the lung apices. Other: Calcific atherosclerosis of carotid siphons. IMPRESSION: CT head: 1. Large right parietal scalp contusion. Nondisplaced acute fracture of right parietal bone extending into the right temporal squamous bone. 2. Contrecoup hemorrhagic cortical contusions of the left anterior frontal and temporal lobes. 3. Thin subdural hematoma along the falx and over left cerebral convexity. 4. Small volume subarachnoid hemorrhage predominantly over the left anterior frontal and temporal lobes. 5. Mass effect associated with thin subdural hematomas resulting in 3 mm left-to-right midline shift. No herniation. CT maxillofacial: 1. Minimally displaced acute fracture of the right zygomatic arch. 2. No additional facial fracture injury. CT cervical spine. 1. No acute fracture or dislocation of the cervical spine. 2. Moderate cervical  spondylosis. 3. Centrilobular emphysema of the lungs. Critical Value/emergent results were called by telephone at the time of interpretation on 06/01/2018 at 9:01 pm to Dr. Nance Pear , who verbally acknowledged these results. Electronically Signed   By: Kristine Garbe M.D.   On: 05/27/2018 21:06     ASSESSMENT AND PLAN:   -Acute encephalopathy Secondary to intracranial hemorrhage and hepatic encephalopathy Currently under comfort measures   subdural hematoma secondary to trauma from fall Patient is comfort care  no aggressive intervention requested by family  -Subarachnoid hemorrhage secondary to trauma from fall Currently under comfort measures  -Overall prognosis poor  - Awaiting bed at hospice facility  -Family updated  All the records are reviewed and case discussed with Care Management/Social Worker. Management plans discussed with the patient, family and they are in agreement.  CODE STATUS: DNR  DVT Prophylaxis: SCDs  TOTAL TIME TAKING CARE OF THIS PATIENT: 23 minutes.   POSSIBLE D/C IN 1 to 2 DAYS, DEPENDING ON CLINICAL CONDITION.  Saundra Shelling M.D on 2018-06-29 at 11:21 AM  Between 7am to 6pm - Pager - [phone]  After 6pm go to [email or web address] - password [email or web address] [facility]  Office  [phone]  CC: Primary care physician; Baxter Hire, MD  Note: This dictation was prepared with Dragon dictation along with smaller phrase technology. Any transcriptional errors that result from this process are unintentional.

## 2018-07-06 NOTE — Progress Notes (Signed)
Patient ID: Alan Bennett, male   DOB: [birth year], 72 y.o.   MRN: [id number]  Pulmonary/critical care attending  Alan Bennett is a 72 year old gentleman, status post fall, parietal fracture, intraparenchymal hematoma, subdural hematoma with a history of atrial fibrillation, hypothyroidism, dementia, heart failure, cirrhosis, hypertension, gastroesophageal reflux disease and abdominal aortic aneurysm, presently on comfort care.  He is on glycopyrrolate, morphine infusion, Ativan as needed and he is resting comfortably and unresponsive. He does not appear in any distress, pain, air hunger or anxious.   Pending floor transfer   Hermelinda Dellen, D.O.

## 2018-07-06 NOTE — Clinical Social Work Note (Signed)
Clinical Social Work Assessment  Patient Details  Name: Alan Bennett MRN: [id number] Date of Birth: Sep 23, 1946  Date of referral:  06/21/18               Reason for consult:  Facility Placement                Permission sought to share information with:  Case Manager, Customer service manager, Family Supports Permission granted to share information::  Yes, Verbal Permission Granted  Name::        Agency::     Relationship::     Contact Information:     Housing/Transportation Living arrangements for the past 2 months:  Single Family Home Source of Information:  Spouse Patient Interpreter Needed:  None Criminal Activity/Legal Involvement Pertinent to Current Situation/Hospitalization:  No - Comment as needed Significant Relationships:  Spouse, Other Family Members Lives with:  Spouse Do you feel safe going back to the place where you live?  No Need for family participation in patient care:  Yes (Comment)  Care giving concerns:  Patient lives with wife in [place] Worker assessment / plan:  CSW notified that patient's family is interested in hospice services. CSW met with patient's wife Alan Bennett at bedside. Wife states that patient lives with her and she has been the primary care giver. Wife states that she would like patient to go to [facility]. CSW explained that a referral would be placed but that we would have to wait for a bed. Wife states she can not take patient home. CSW notified Santiago Glad, hospice liaison of referral for hospice home. CSW will follow for discharge planning.   Employment status:  Retired Forensic scientist:  Medicare PT Recommendations:  Not assessed at this time [place] / Referral to community resources:  Other (Comment Required)(Hospice home )  Patient/Family's Response to care:  Patient is unresponsive   Patient/Family's Understanding of and Emotional Response to Diagnosis, Current Treatment, and  Prognosis:  Wife thanked CSW for assistance. She is in agreement with hospice home placement.   Emotional Assessment Appearance:  Appears stated age Attitude/Demeanor/Rapport:  Unresponsive Affect (typically observed):    Orientation:    Alcohol / Substance use:  Not Applicable Psych involvement (Current and /or in the community):  No (Comment)  Discharge Needs  Concerns to be addressed:  Discharge Planning Concerns Readmission within the last 30 days:  No Current discharge risk:  None Barriers to Discharge:  Hospice Bed not available   Annamaria Boots, [place] 06-21-18, 11:50 AM

## 2018-07-06 NOTE — Progress Notes (Signed)
Attempted calling report to 1A. Nurse currently receiving report on other Pt. Will notify ICU nurse assuming care.

## 2018-07-06 NOTE — Discharge Summary (Signed)
Death summary  Date of admission 06-18-2018 Date of death 06-20-18  Cause of death 1.  Acute encephalopathy 2.  Intracranial hemorrhage 3.  Subdural hematoma 4.  Subarachnoid hemorrhage 5.  Acute cardiorespiratory failure  History of present illness and brief course of hospital stay 72 year old male patient with a known history of advanced cirrhosis of liver, coronary artery disease, chronic kidney disease, thyroid disorder was admitted on 06-18-18 for fall, acute encephalopathy.  Patient had intracranial hemorrhage secondary to subdural hematoma and subarachnoid hemorrhage and was encephalopathic at the time of admission.  He had a right parietal bone fracture.  Patient is a poor surgical candidate considering multiple medical problems and advanced cirrhosis of liver.  Patient is DNR by CODE STATUS and family did not want any aggressive measures such as neurosurgery.  Conservative management was recommended.  Patient was admitted to ICU.  Patient's family wanted palliative care team evaluation and comfort care only.  Patient was made comfort care and all active treatment was withdrawn.  Patient continued to deteriorate and expired on 06/2018.  Family was updated regarding the death of the patient.

## 2018-07-06 NOTE — Progress Notes (Signed)
Patient expired at 8:10 pm family at bedside, MD [facility] called by this RN. [place] donor called at 8:45. Post-mortem care done.

## 2018-07-06 NOTE — Plan of Care (Signed)

## 2018-07-06 DEATH — deceased

## 2018-09-01 ENCOUNTER — Encounter: Payer: Self-pay | Admitting: Nurse Practitioner

## 2019-02-02 IMAGING — CT CT HEAD W/O CM
3 series · 16 of 47 positions shown, 19 images · non-contrast
Comparison: None.

CLINICAL DATA: Unintended weight loss.

EXAM:
CT HEAD WITHOUT CONTRAST
TECHNIQUE: Contiguous axial images were obtained from the base of the skull
through the vertex without intravenous contrast.

[Series 2: head wo · axial · 0.42mm/px · z∈[-114,+11]mm · 10 of 30 slices shown, 13 images]
[im 3/30  brain]
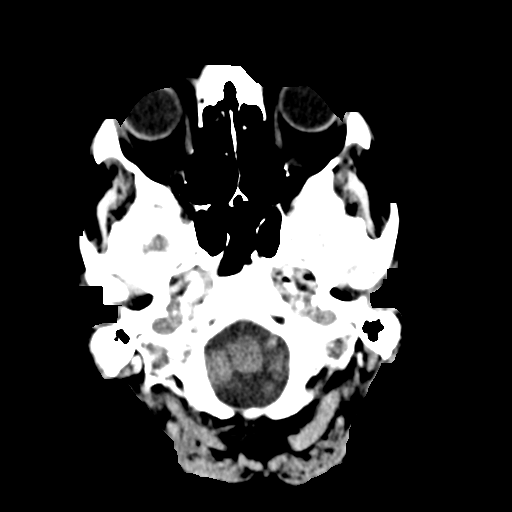
[im 3/30  bone]
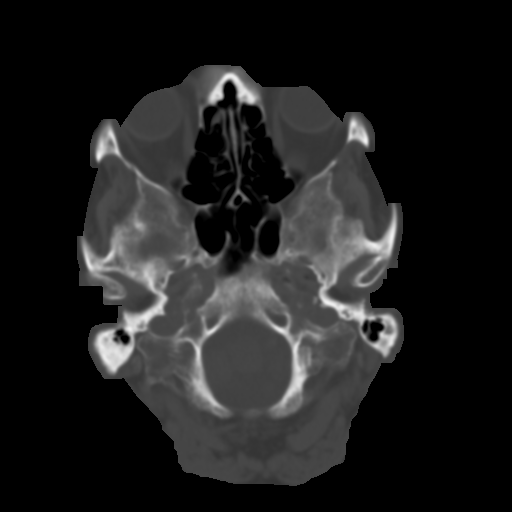
[im 6/30  brain]
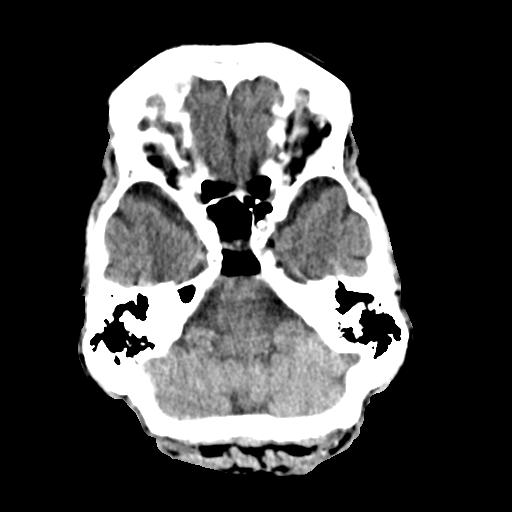
[im 9/30  brain]
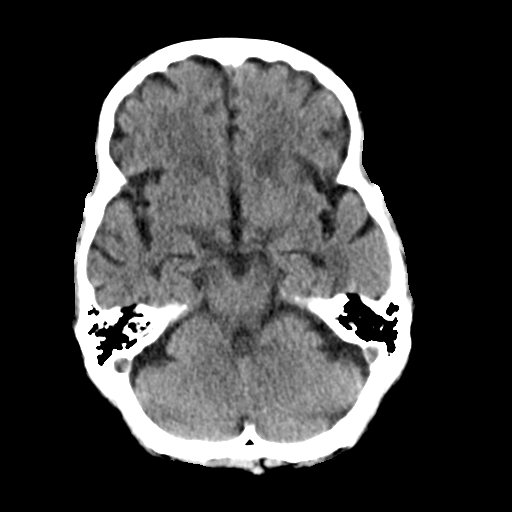
[im 11/30  brain]
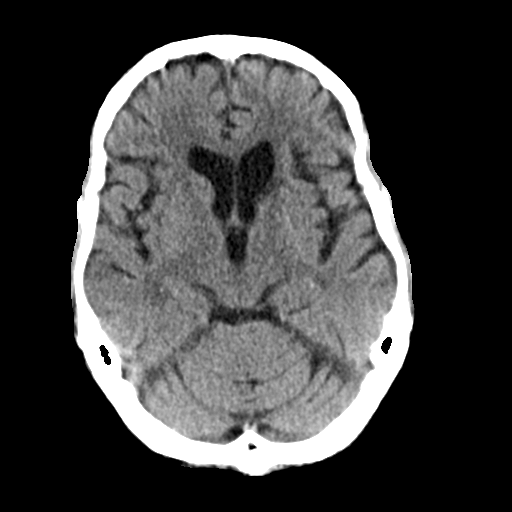
[im 14/30  brain]
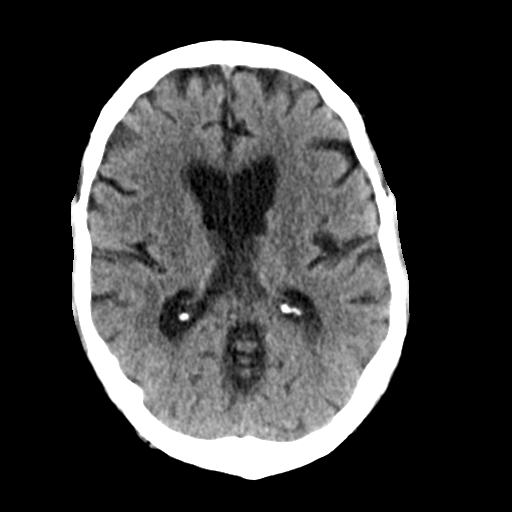
[im 14/30  bone]
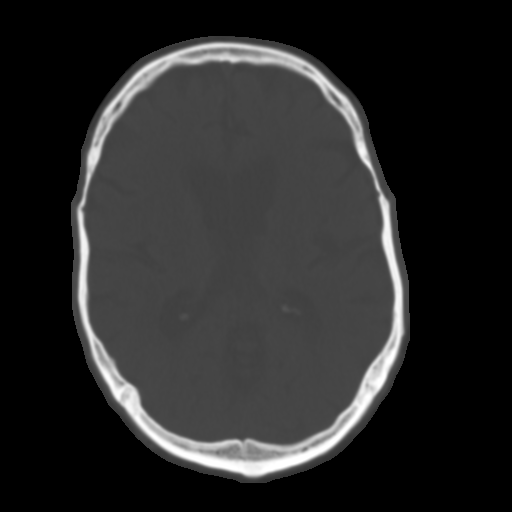
[im 17/30  brain]
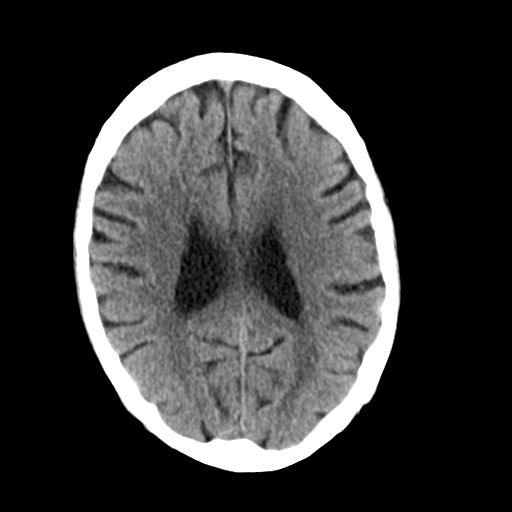
[im 20/30  brain]
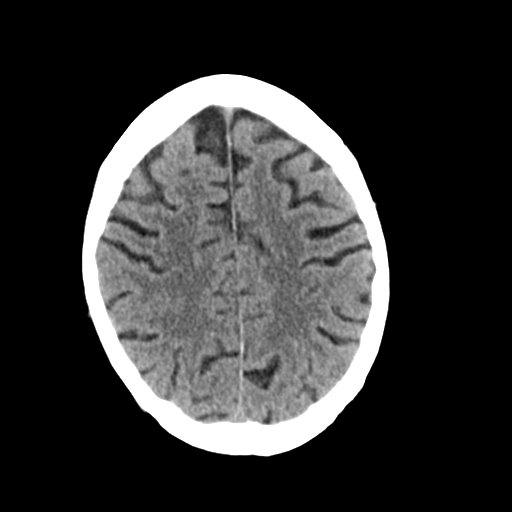
[im 23/30  brain]
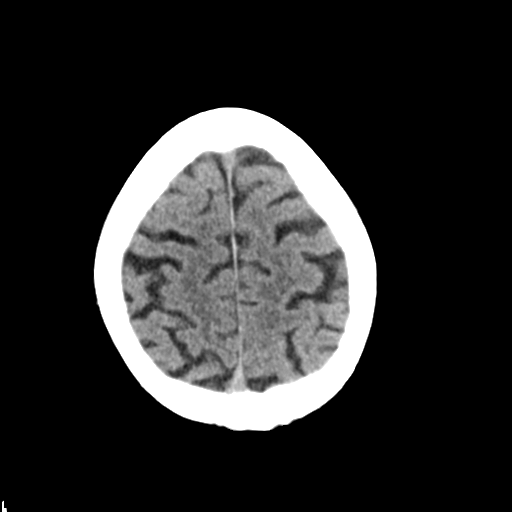
[im 25/30  brain]
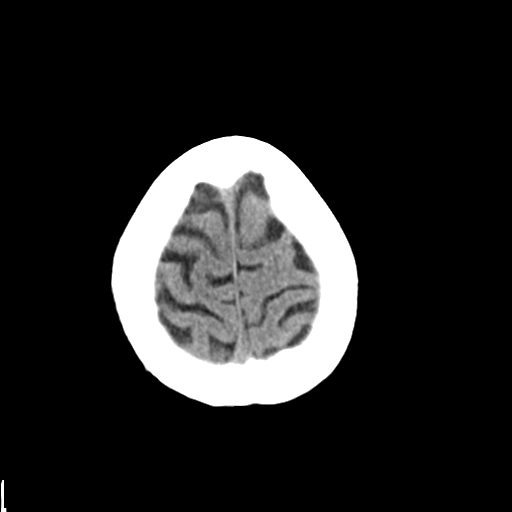
[im 25/30  bone]
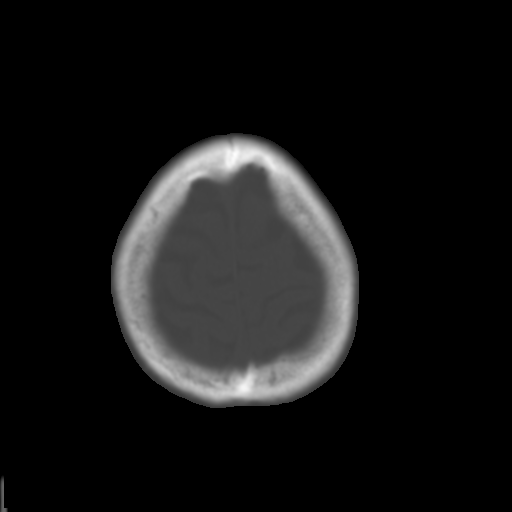
[im 28/30  brain]
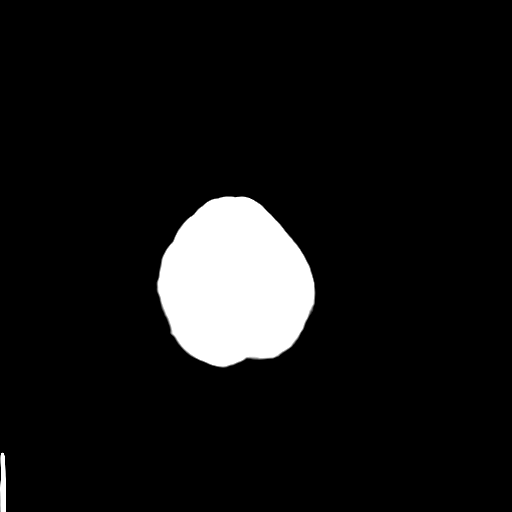

[Series 4: coronal soft tissue · coronal · 0.31mm/px · 3 of 65 slices shown]
[im 22/65  brain]
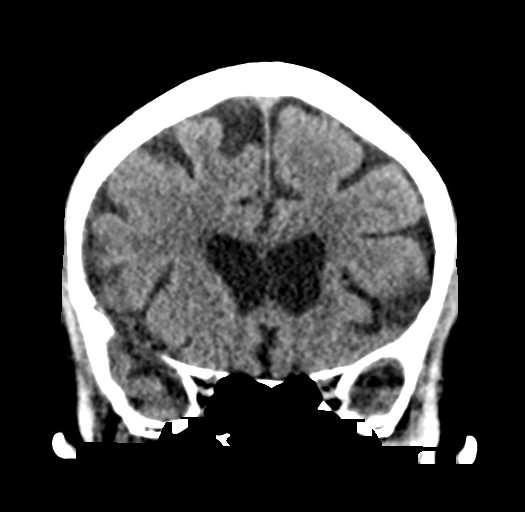
[im 29/65  brain]
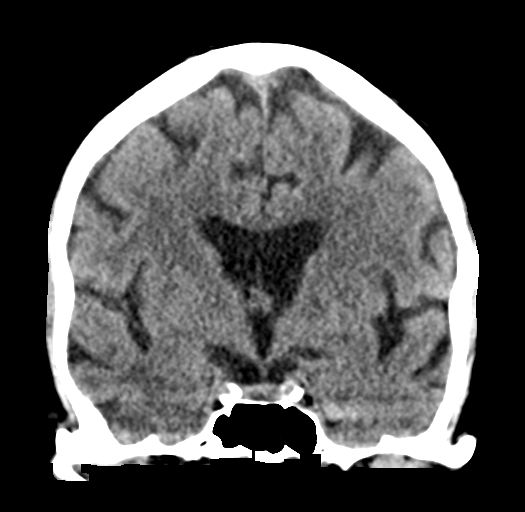
[im 36/65  brain]
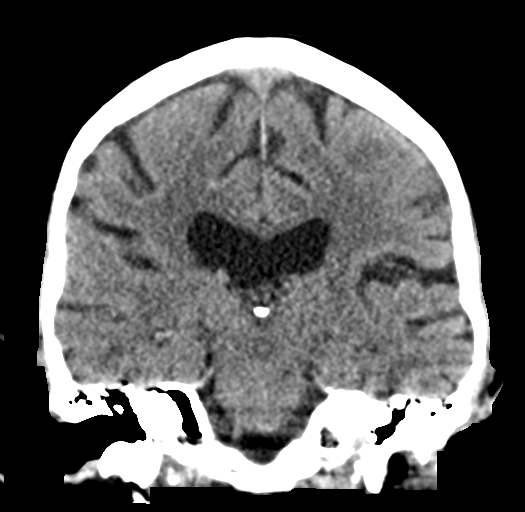

[Series 5: sagittal soft tissue · sagittal · 0.31mm/px · 3 of 55 slices shown]
[im 19/55  brain]
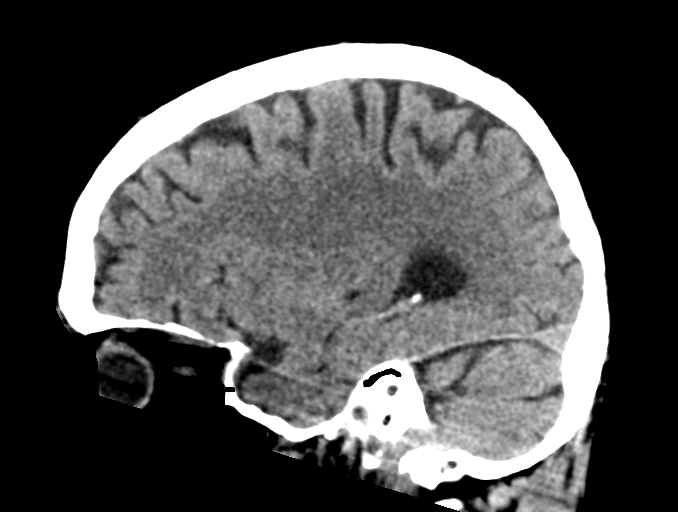
[im 28/55  brain]
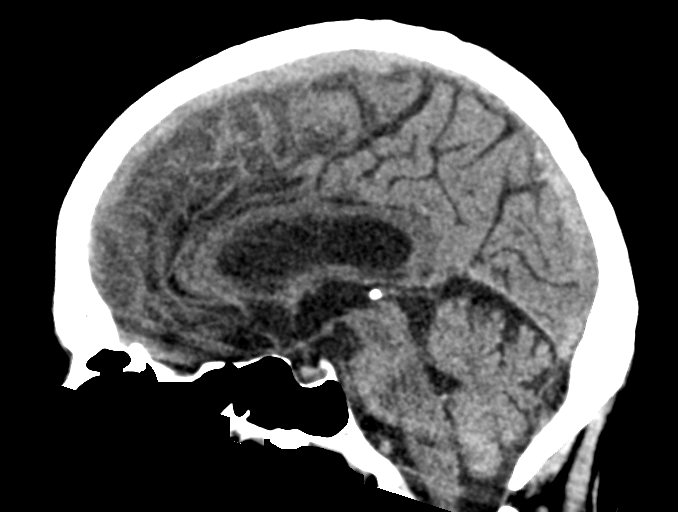
[im 37/55  brain]
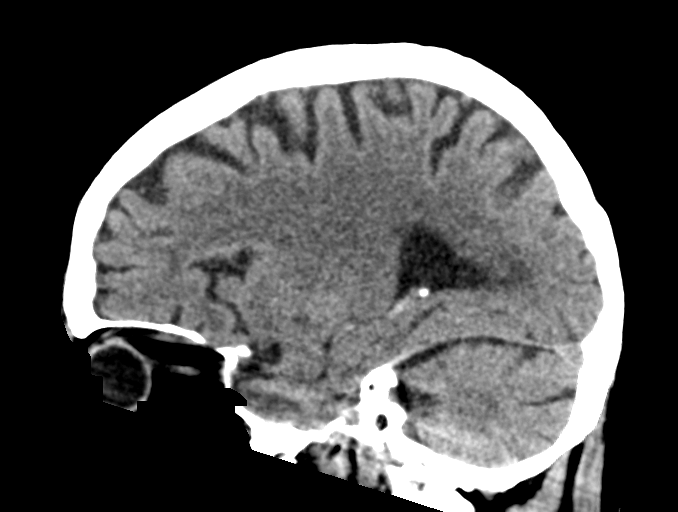

[16 of 47 positions shown; findings below may reference images not displayed]

FINDINGS: Brain: No subdural, epidural, or subarachnoid hemorrhage.
Cerebellum, brainstem, and basal cisterns are normal. Ventricles and
sulci are mildly prominent, likely due to mild atrophy. No acute
cortical ischemia or infarct. No mass effect or midline shift.

Vascular: No hyperdense vessel or unexpected calcification.

Skull: Normal. Negative for fracture or focal lesion.

Sinuses/Orbits: No acute finding.

Other: None.
IMPRESSION: No acute intracranial abnormality to explain the patient's weight
loss.
